# Patient Record
Sex: Male | Born: 1975 | Race: Black or African American | Hispanic: No | Marital: Single | Smoking: Former smoker
Health system: Southern US, Community
[De-identification: ages and names within clinical notes are randomized; demographics above are authoritative.]

---

## 2013-02-06 ENCOUNTER — Inpatient Hospital Stay (HOSPITAL_COMMUNITY)
Admission: EM | Admit: 2013-02-06 | Discharge: 2013-02-08 | DRG: 392 | Disposition: A | Discharge status: Home / Self Care | Payer: MEDICAID | Attending: Internal Medicine | Admitting: Internal Medicine

## 2013-02-06 ENCOUNTER — Encounter (HOSPITAL_COMMUNITY): Payer: Self-pay | Admitting: *Deleted

## 2013-02-06 DIAGNOSIS — K529 Noninfective gastroenteritis and colitis, unspecified: Secondary | ICD-10-CM | POA: Diagnosis present

## 2013-02-06 DIAGNOSIS — R1115 Cyclical vomiting syndrome unrelated to migraine: Secondary | ICD-10-CM

## 2013-02-06 DIAGNOSIS — R509 Fever, unspecified: Secondary | ICD-10-CM

## 2013-02-06 DIAGNOSIS — K5289 Other specified noninfective gastroenteritis and colitis: Principal | ICD-10-CM | POA: Diagnosis present

## 2013-02-06 DIAGNOSIS — R109 Unspecified abdominal pain: Secondary | ICD-10-CM

## 2013-02-06 DIAGNOSIS — F17200 Nicotine dependence, unspecified, uncomplicated: Secondary | ICD-10-CM | POA: Diagnosis present

## 2013-02-06 DIAGNOSIS — E876 Hypokalemia: Secondary | ICD-10-CM | POA: Diagnosis present

## 2013-02-06 LAB — URINALYSIS, ROUTINE W REFLEX MICROSCOPIC
Bilirubin Urine: NEGATIVE
Glucose, UA: NEGATIVE mg/dL
Hgb urine dipstick: NEGATIVE
Ketones, ur: NEGATIVE mg/dL
Leukocytes, UA: NEGATIVE
Protein, ur: NEGATIVE mg/dL
pH: 5.5 (ref 5.0–8.0)

## 2013-02-06 LAB — CBC WITH DIFFERENTIAL/PLATELET
Basophils Absolute: 0 10*3/uL (ref 0.0–0.1)
Basophils Relative: 0 % (ref 0–1)
Eosinophils Relative: 1 % (ref 0–5)
HCT: 48 % (ref 39.0–52.0)
Hemoglobin: 17.4 g/dL — ABNORMAL HIGH (ref 13.0–17.0)
MCHC: 36.3 g/dL — ABNORMAL HIGH (ref 30.0–36.0)
MCV: 93 fL (ref 78.0–100.0)
Monocytes Absolute: 0.7 10*3/uL (ref 0.1–1.0)
Monocytes Relative: 4 % (ref 3–12)
Neutro Abs: 14.5 10*3/uL — ABNORMAL HIGH (ref 1.7–7.7)
RDW: 13.4 % (ref 11.5–15.5)

## 2013-02-06 LAB — COMPREHENSIVE METABOLIC PANEL
BUN: 8 mg/dL (ref 6–23)
CO2: 27 mEq/L (ref 19–32)
Calcium: 9 mg/dL (ref 8.4–10.5)
Chloride: 100 mEq/L (ref 96–112)
Creatinine, Ser: 1 mg/dL (ref 0.50–1.35)
GFR calc non Af Amer: >90 mL/min (ref >90)
Total Bilirubin: 0.7 mg/dL (ref 0.3–1.2)

## 2013-02-06 LAB — LIPASE, BLOOD: Lipase: 32 U/L (ref 11–59)

## 2013-02-06 MED ORDER — FENTANYL CITRATE 0.05 MG/ML IJ SOLN
50.0000 ug | INTRAMUSCULAR | Status: DC | PRN
  Administered 2013-02-06: 50 ug via INTRAVENOUS
  Filled 2013-02-06: qty 2

## 2013-02-06 MED ORDER — POTASSIUM CHLORIDE 10 MEQ/100ML IV SOLN
10.0000 meq | Freq: Once | INTRAVENOUS | Status: AC
  Administered 2013-02-06: 10 meq via INTRAVENOUS
  Filled 2013-02-06: qty 100

## 2013-02-06 MED ORDER — SODIUM CHLORIDE 0.9 % IV BOLUS (SEPSIS)
1000.0000 mL | Freq: Once | INTRAVENOUS | Status: AC
  Administered 2013-02-06: 1000 mL via INTRAVENOUS

## 2013-02-06 MED ORDER — ONDANSETRON HCL 4 MG/2ML IJ SOLN
4.0000 mg | Freq: Once | INTRAMUSCULAR | Status: AC
  Administered 2013-02-06: 4 mg via INTRAVENOUS
  Filled 2013-02-06: qty 2

## 2013-02-06 NOTE — ED Notes (Signed)
The pt is c/o abd pain  Soreness in all  His muscles nv and diarrhea with a temp for 3-4 days

## 2013-02-06 NOTE — ED Provider Notes (Signed)
History     CSN: [id number]  Arrival date & time 02/06/13  2015   First MD Initiated Contact with Patient 02/06/13 2337      Chief Complaint  Patient presents with  . multiple complaints     (Consider location/radiation/quality/duration/timing/severity/associated sxs/prior treatment) HPI History per PT - feeling bad since last night with lower ABD pain RLQ and some LLQ with associated fever and N/V/D, no blood in emesis or stools, no sick contacts or recent travel, no h/o same, no rash. Pain is sharp waxes and wanes. Mod in severity. No difficulty urinating.   History reviewed. No pertinent past medical history.  History reviewed. No pertinent past surgical history.  No family history on file.  History  Substance Use Topics  . Smoking status: Current Every Day Smoker  . Smokeless tobacco: Not on file  . Alcohol Use: Yes      Review of Systems  Constitutional: Positive for fever and chills.  HENT: Negative for neck pain and neck stiffness.   Eyes: Negative for pain.  Respiratory: Negative for shortness of breath.   Cardiovascular: Negative for chest pain.  Gastrointestinal: Positive for vomiting, abdominal pain and diarrhea. Negative for blood in stool.  Genitourinary: Negative for dysuria and flank pain.  Musculoskeletal: Negative for back pain.  Skin: Negative for rash.  Neurological: Negative for headaches.  All other systems reviewed and are negative.    Allergies  Review of patient's allergies indicates not on file.  Home Medications  No current outpatient prescriptions on file.  BP 134/93  Pulse 112  Temp(Src) 100.7 F (38.2 C) (Oral)  Resp 20  SpO2 98%  Physical Exam  Constitutional: He is oriented to person, place, and time. He appears well-developed and well-nourished.  HENT:  Head: Normocephalic and atraumatic.  Eyes: EOM are normal. Pupils are equal, round, and reactive to light.  Neck: Neck supple.  Cardiovascular: Regular rhythm and  intact distal pulses.   Mild tachycardia  Pulmonary/Chest: Effort normal and breath sounds normal. No respiratory distress. He exhibits no tenderness.  Abdominal: Soft. Bowel sounds are normal. He exhibits no distension. There is no rebound and no guarding.  TTP RLQ and mild TTP LLQ, no CVAT  Musculoskeletal: Normal range of motion. He exhibits no edema.  Neurological: He is alert and oriented to person, place, and time.  Skin: Skin is warm and dry.    ED Course  Procedures (including critical care time)  Results for orders placed during the hospital encounter of 02/06/13  URINALYSIS, ROUTINE W REFLEX MICROSCOPIC      Result Value Range   Color, Urine YELLOW  YELLOW   APPearance CLEAR  CLEAR   Specific Gravity, Urine 1.024  1.005 - 1.030   pH 5.5  5.0 - 8.0   Glucose, UA NEGATIVE  NEGATIVE mg/dL   Hgb urine dipstick NEGATIVE  NEGATIVE   Bilirubin Urine NEGATIVE  NEGATIVE   Ketones, ur NEGATIVE  NEGATIVE mg/dL   Protein, ur NEGATIVE  NEGATIVE mg/dL   Urobilinogen, UA 0.2  0.0 - 1.0 mg/dL   Nitrite NEGATIVE  NEGATIVE   Leukocytes, UA NEGATIVE  NEGATIVE  CBC WITH DIFFERENTIAL      Result Value Range   WBC 16.0 (*) 4.0 - 10.5 K/uL   RBC 5.16  4.22 - 5.81 MIL/uL   Hemoglobin 17.4 (*) 13.0 - 17.0 g/dL   HCT 40.9  81.1 - 91.4 %   MCV 93.0  78.0 - 100.0 fL   MCH 33.7  26.0 -  34.0 pg   MCHC 36.3 (*) 30.0 - 36.0 g/dL   RDW 16.1  09.6 - 04.5 %   Platelets 253  150 - 400 K/uL   Neutrophils Relative % 90 (*) 43 - 77 %   Neutro Abs 14.5 (*) 1.7 - 7.7 K/uL   Lymphocytes Relative 5 (*) 12 - 46 %   Lymphs Abs 0.8  0.7 - 4.0 K/uL   Monocytes Relative 4  3 - 12 %   Monocytes Absolute 0.7  0.1 - 1.0 K/uL   Eosinophils Relative 1  0 - 5 %   Eosinophils Absolute 0.1  0.0 - 0.7 K/uL   Basophils Relative 0  0 - 1 %   Basophils Absolute 0.0  0.0 - 0.1 K/uL  COMPREHENSIVE METABOLIC PANEL      Result Value Range   Sodium 136  135 - 145 mEq/L   Potassium 3.1 (*) 3.5 - 5.1 mEq/L   Chloride  100  96 - 112 mEq/L   CO2 27  19 - 32 mEq/L   Glucose, Bld 111 (*) 70 - 99 mg/dL   BUN 8  6 - 23 mg/dL   Creatinine, Ser 4.09  0.50 - 1.35 mg/dL   Calcium 9.0  8.4 - 81.1 mg/dL   Total Protein 7.1  6.0 - 8.3 g/dL   Albumin 4.0  3.5 - 5.2 g/dL   AST 17  0 - 37 U/L   ALT 14  0 - 53 U/L   Alkaline Phosphatase 51  39 - 117 U/L   Total Bilirubin 0.7  0.3 - 1.2 mg/dL   GFR calc non Af Amer >90  >90 mL/min   GFR calc Af Amer >90  >90 mL/min  LIPASE, BLOOD      Result Value Range   Lipase 32  11 - 59 U/L   Ct Abdomen Pelvis W Contrast  02/07/2013   *RADIOLOGY REPORT*  Clinical Data: Lower abdominal pain and fever.  Nausea and vomiting.  CT ABDOMEN AND PELVIS WITH CONTRAST  Technique:  Multidetector CT imaging of the abdomen and pelvis was performed following the standard protocol during bolus administration of intravenous contrast.  Contrast: OMNIPAQUE IOHEXOL 300 MG/ML  SOLN  Comparison: No priors.  Comments:  The examination is significantly limited by a large amount of gross patient motion.  Per report from the technologist, the patient began vomiting during image acquisition.  Findings:  Lung Bases: Unremarkable.  Abdomen/Pelvis:  With the limitations of this study in mind, the appearance of the liver, gallbladder, pancreas, spleen, bilateral adrenal glands and bilateral kidneys is unremarkable.  Normal appendix.  No significant volume of ascites.  No pneumoperitoneum. There are multiple borderline dilated loops of small bowel in the upper abdomen measuring up to 2.9 cm in diameter, however, no definite pathologic dilatation of small bowel is identified.  Oral contrast does extend into the colon.  The prostate gland and urinary bladder are unremarkable in appearance.  No definite pathologic lymphadenopathy identified within the abdomen or pelvis.  Musculoskeletal: There are no aggressive appearing lytic or blastic lesions noted in the visualized portions of the skeleton. Bilateral pars defects  are present at L4 and there is 6 mm of anterolisthesis of L4 upon L5.  IMPRESSION: 1.  No definite acute findings in the abdomen or pelvis to account for the patient's symptoms. 2.  Spondylolisthesis at L4-L5 with 6 mm of anterolisthesis of L4 upon L5.   Original Report Authenticated By: Trudie Reed, M.D.  2:56 AM persistent emesis, d/w [facility], DR Eben Burow to admit  IVFs, NPO, IV fentanyl, zofran IV potassium for hypokalemia  MDM  ABD pain N/V/D, fever CT, labs Persistent emesis, plan MED admit        Sunnie Nielsen, MD 02/07/13 [phone]

## 2013-02-06 NOTE — ED Notes (Signed)
Pt c/o N/V/D, chills, night sweats.

## 2013-02-07 ENCOUNTER — Encounter (HOSPITAL_COMMUNITY): Payer: Self-pay | Admitting: Radiology

## 2013-02-07 ENCOUNTER — Emergency Department (HOSPITAL_COMMUNITY): Payer: Self-pay

## 2013-02-07 DIAGNOSIS — K529 Noninfective gastroenteritis and colitis, unspecified: Secondary | ICD-10-CM | POA: Diagnosis present

## 2013-02-07 DIAGNOSIS — E876 Hypokalemia: Secondary | ICD-10-CM | POA: Diagnosis present

## 2013-02-07 DIAGNOSIS — R109 Unspecified abdominal pain: Secondary | ICD-10-CM

## 2013-02-07 LAB — CLOSTRIDIUM DIFFICILE BY PCR: Toxigenic C. Difficile by PCR: NEGATIVE

## 2013-02-07 MED ORDER — ONDANSETRON HCL 4 MG/2ML IJ SOLN: 4.0000 mg | Freq: Three times a day (TID) | INTRAMUSCULAR | Status: DC | PRN

## 2013-02-07 MED ORDER — METRONIDAZOLE IN NACL 5-0.79 MG/ML-% IV SOLN
500.0000 mg | Freq: Three times a day (TID) | INTRAVENOUS | Status: DC
  Administered 2013-02-07 – 2013-02-08 (×4): 500 mg via INTRAVENOUS
  Filled 2013-02-07 (×6): qty 100

## 2013-02-07 MED ORDER — IOHEXOL 300 MG/ML  SOLN
50.0000 mL | Freq: Once | INTRAMUSCULAR | Status: AC | PRN
  Administered 2013-02-07: 50 mL via ORAL

## 2013-02-07 MED ORDER — BIOTENE DRY MOUTH MT LIQD: 15.0000 mL | Freq: Two times a day (BID) | OROMUCOSAL | Status: DC

## 2013-02-07 MED ORDER — ENOXAPARIN SODIUM 40 MG/0.4ML ~~LOC~~ SOLN
40.0000 mg | Freq: Every day | SUBCUTANEOUS | Status: DC
  Administered 2013-02-07: 40 mg via SUBCUTANEOUS
  Filled 2013-02-07 (×2): qty 0.4

## 2013-02-07 MED ORDER — SODIUM CHLORIDE 0.9 % IV SOLN
INTRAVENOUS | Status: AC
Start: 2013-02-07 — End: 2013-02-07
  Administered 2013-02-07: 150 mL/h via INTRAVENOUS

## 2013-02-07 MED ORDER — CIPROFLOXACIN IN D5W 400 MG/200ML IV SOLN
400.0000 mg | Freq: Two times a day (BID) | INTRAVENOUS | Status: DC
  Administered 2013-02-07 – 2013-02-08 (×3): 400 mg via INTRAVENOUS
  Filled 2013-02-07 (×4): qty 200

## 2013-02-07 MED ORDER — IOHEXOL 300 MG/ML  SOLN
100.0000 mL | Freq: Once | INTRAMUSCULAR | Status: AC | PRN
  Administered 2013-02-07: 100 mL via INTRAVENOUS

## 2013-02-07 MED ORDER — CHLORHEXIDINE GLUCONATE 0.12 % MT SOLN
15.0000 mL | Freq: Two times a day (BID) | OROMUCOSAL | Status: DC
  Administered 2013-02-07: 15 mL via OROMUCOSAL
  Filled 2013-02-07: qty 15

## 2013-02-07 MED ORDER — SODIUM CHLORIDE 0.9 % IV SOLN
INTRAVENOUS | Status: DC
  Administered 2013-02-07 – 2013-02-08 (×2): via INTRAVENOUS

## 2013-02-07 MED ORDER — ONDANSETRON 8 MG/NS 50 ML IVPB
8.0000 mg | Freq: Four times a day (QID) | INTRAVENOUS | Status: DC | PRN
  Filled 2013-02-07 (×2): qty 8

## 2013-02-07 MED ORDER — MORPHINE SULFATE 2 MG/ML IJ SOLN
2.0000 mg | INTRAMUSCULAR | Status: DC | PRN
  Administered 2013-02-07: 2 mg via INTRAVENOUS
  Filled 2013-02-07: qty 1

## 2013-02-07 MED ORDER — ONDANSETRON HCL 4 MG/2ML IJ SOLN: 4.0000 mg | Freq: Four times a day (QID) | INTRAMUSCULAR | Status: DC | PRN

## 2013-02-07 MED ORDER — SODIUM CHLORIDE 0.9 % IV BOLUS (SEPSIS): 1000.0000 mL | Freq: Once | INTRAVENOUS | Status: DC

## 2013-02-07 MED ORDER — POTASSIUM CHLORIDE 10 MEQ/100ML IV SOLN
10.0000 meq | INTRAVENOUS | Status: AC
Start: 2013-02-07 — End: 2013-02-07
  Administered 2013-02-07 (×6): 10 meq via INTRAVENOUS
  Filled 2013-02-07 (×4): qty 100
  Filled 2013-02-07: qty 200

## 2013-02-07 MED ORDER — ONDANSETRON HCL 4 MG/2ML IJ SOLN
4.0000 mg | Freq: Once | INTRAMUSCULAR | Status: AC
  Administered 2013-02-07: 4 mg via INTRAVENOUS
  Filled 2013-02-07: qty 2

## 2013-02-07 NOTE — H&P (Signed)
History and Physical  [name] NWG:[id number] DOB: 10/08/1975 DOA: 02/06/2013  Referring physician: Sunnie Nielsen MD PCP: No PCP Per Patient   Chief Complaint: Abdominal pain, nausea and vomiting  HPI: Patient is a 37 year old man with no significant past medical history who presents with lower abdominal pain for 3 days and associated vomiting and diarrhea since 1 day prior to admission. Patient works at [employer] and usually eats at work itself. He denies any [place] or food which had been For a long time. Patient started having abdominal pain 3 days prior to admission. Located in the lower abdomen, nonradiating, cramping in character, progressively got worse, 8/10 at its worse, no exacerbating or relieving factors. Associated with vomiting and diarrhea since yesterday. Patient has had about 4-5 episodes of watery diarrhea. Has vomited about 6-7 times. Vomitus contained food particles and anything that he eats. Patient also had fevers along with chills but did not take a temperature.  Initial evaluation in the ER was suggestive of fevers up to 100.7, tachycardia 112, elevated white count and persistent abdominal pain. Hospitalist team was asked to admit the patient for further evaluation and management.  15 point review of systems was negative except for as noted in the history of present illness.   History reviewed. No pertinent past medical history.  History reviewed. No pertinent past surgical history.  Social History:  reports that he has been smoking.  He does not have any smokeless tobacco history on file. He reports that  drinks alcohol. His drug history is not on file.  No Known Allergies  No family history on file. Family history is noncontributory  Prior to Admission medications   Not on File   Physical Exam: Filed Vitals:   02/06/13 2330 02/07/13 0247 02/07/13 0307 02/07/13 0312  BP: 134/93 125/76 128/93   Pulse: 112 100 105   Temp: 100.7 F (38.2 C)   99.1 F (37.3  C)  TempSrc: Oral   Oral  Resp: 20 18 16    SpO2: 98% 96% 94%    Physical Exam: General: Vital signs reviewed and noted. Well-developed, well-nourished, in no acute distress; alert, appropriate and cooperative throughout examination.  Head: Normocephalic, atraumatic.  Eyes: PERRL, EOMI, No signs of anemia or jaundince.  Nose: Mucous membranes moist, not inflammed, nonerythematous.  Throat: Oropharynx nonerythematous, no exudate appreciated.   Neck: No deformities, masses, or tenderness noted.Supple, No carotid Bruits, no JVD.  Lungs:  Normal respiratory effort. Clear to auscultation BL without crackles or wheezes.  Heart:  tachycardic RR. S1 and S2 normal without gallop, murmur, or rubs.  Abdomen:  BS hyperactive. Soft, Nondistended, tender to palpation in the lower mid abdomen.  No masses or organomegaly.  Extremities: No pretibial edema.  Neurologic: A&O X3, CN II - XII are grossly intact. Motor strength is 5/5 in the all 4 extremities, Sensations intact to light touch, Cerebellar signs negative.  Skin: No visible rashes, scars.     Wt Readings from Last 3 Encounters:  No data found for Wt    Labs on Admission:  Basic Metabolic Panel:  Recent Labs Lab 02/06/13 2025  NA 136  K 3.1*  CL 100  CO2 27  GLUCOSE 111*  BUN 8  CREATININE 1.00  CALCIUM 9.0    Liver Function Tests:  Recent Labs Lab 02/06/13 2025  AST 17  ALT 14  ALKPHOS 51  BILITOT 0.7  PROT 7.1  ALBUMIN 4.0    Recent Labs Lab 02/06/13 2025  LIPASE 32  CBC:  Recent Labs Lab 02/06/13 2025  WBC 16.0*  NEUTROABS 14.5*  HGB 17.4*  HCT 48.0  MCV 93.0  PLT 253     Radiological Exams on Admission: Ct Abdomen Pelvis W Contrast  02/07/2013   *RADIOLOGY REPORT*  Clinical Data: Lower abdominal pain and fever.  Nausea and vomiting.  CT ABDOMEN AND PELVIS WITH CONTRAST  Technique:  Multidetector CT imaging of the abdomen and pelvis was performed following the standard protocol during bolus  administration of intravenous contrast.  Contrast: OMNIPAQUE IOHEXOL 300 MG/ML  SOLN  Comparison: No priors.  Comments:  The examination is significantly limited by a large amount of gross patient motion.  Per report from the technologist, the patient began vomiting during image acquisition.  Findings:  Lung Bases: Unremarkable.  Abdomen/Pelvis:  With the limitations of this study in mind, the appearance of the liver, gallbladder, pancreas, spleen, bilateral adrenal glands and bilateral kidneys is unremarkable.  Normal appendix.  No significant volume of ascites.  No pneumoperitoneum. There are multiple borderline dilated loops of small bowel in the upper abdomen measuring up to 2.9 cm in diameter, however, no definite pathologic dilatation of small bowel is identified.  Oral contrast does extend into the colon.  The prostate gland and urinary bladder are unremarkable in appearance.  No definite pathologic lymphadenopathy identified within the abdomen or pelvis.  Musculoskeletal: There are no aggressive appearing lytic or blastic lesions noted in the visualized portions of the skeleton. Bilateral pars defects are present at L4 and there is 6 mm of anterolisthesis of L4 upon L5.  IMPRESSION: 1.  No definite acute findings in the abdomen or pelvis to account for the patient's symptoms. 2.  Spondylolisthesis at L4-L5 with 6 mm of anterolisthesis of L4 upon L5.   Original Report Authenticated By: Trudie Reed, M.D.     Principal Problem:   Acute gastroenteritis Active Problems:   Hypokalemia   Assessment/Plan Patient is a 37 year old man with no significant past medical history who was admitted for severe lower abdominal pain, nausea, vomiting and diarrhea. With fevers and chills and elevated white count but this seems that patient has had acute gastroenteritis.  -Admit the patient to MedSurg -IV fluids -Ondansetron for control of nausea and vomiting -Pain control with morphine -IV ciprofloxacin  and Flagyl -C. difficile PCR -Stools for a while and parasites -Blood cultures drawn in the ER -Follow clinically -Replete electrolytes -stool culture  Code Status: Full code Family Communication: Mother updated at bedside Disposition Plan/Anticipated LOS: Guarded  Time spent: 52 minutes  Lars Mage, MD  [facility] Team 5  If 7PM-7AM, please contact night-coverage at [email or web address], password [facility] 02/07/2013, 4:06 AM

## 2013-02-07 NOTE — Progress Notes (Signed)
PAteint admitted early this AM by Dr. Eben Burow- please see H&p.  Patient sleeping- will start clear diet and advance as tolerated.  Labs in AM  [place] DO

## 2013-02-07 NOTE — ED Notes (Signed)
Collis Thede cell [phone] house [phone]

## 2013-02-08 DIAGNOSIS — R1115 Cyclical vomiting syndrome unrelated to migraine: Secondary | ICD-10-CM

## 2013-02-08 DIAGNOSIS — E876 Hypokalemia: Secondary | ICD-10-CM

## 2013-02-08 DIAGNOSIS — K5289 Other specified noninfective gastroenteritis and colitis: Principal | ICD-10-CM

## 2013-02-08 LAB — GI PATHOGEN PANEL BY PCR, STOOL
C difficile toxin A/B: NEGATIVE
Campylobacter by PCR: NEGATIVE
E coli (ETEC) LT/ST: NEGATIVE
E coli (STEC): NEGATIVE
Norovirus GI/GII: POSITIVE

## 2013-02-08 LAB — CBC
HCT: 42.3 % (ref 39.0–52.0)
Hemoglobin: 14.8 g/dL (ref 13.0–17.0)
MCV: 93.4 fL (ref 78.0–100.0)
RBC: 4.53 MIL/uL (ref 4.22–5.81)
WBC: 7.8 10*3/uL (ref 4.0–10.5)

## 2013-02-08 LAB — OVA AND PARASITE EXAMINATION: Ova and parasites: NONE SEEN

## 2013-02-08 LAB — BASIC METABOLIC PANEL
BUN: 7 mg/dL (ref 6–23)
CO2: 30 mEq/L (ref 19–32)
Chloride: 103 mEq/L (ref 96–112)
Creatinine, Ser: 1.1 mg/dL (ref 0.50–1.35)
Glucose, Bld: 98 mg/dL (ref 70–99)

## 2013-02-08 NOTE — Discharge Summary (Signed)
Physician Discharge Summary  [name] WGN:[id number] DOB: 1976/01/19 DOA: 02/06/2013  PCP: No PCP Per Patient  Admit date: 02/06/2013 Discharge date: 02/08/2013  Time spent: 30 minutes  Recommendations for Outpatient Follow-up:  Advance diet as tolerated WASH HANDS FREQUENTLY WITH SOAP AND WATER  Discharge Diagnoses:  Principal Problem:   Acute gastroenteritis Active Problems:   Hypokalemia   Discharge Condition: Improved  Diet recommendation: Regular as tolerated  Filed Weights   02/07/13 0500  Weight: 88.451 kg (195 lb)    History of present illness:  Patient is a 37 year old man with no significant past medical history who presents with lower abdominal pain for 3 days and associated vomiting and diarrhea since 1 day prior to admission. Patient works at [employer] and usually eats at work itself. He denies any [place] or food which had been For a long time. Patient started having abdominal pain 3 days prior to admission. Located in the lower abdomen, nonradiating, cramping in character, progressively got worse, 8/10 at its worse, no exacerbating or relieving factors. Associated with vomiting and diarrhea since yesterday. Patient has had about 4-5 episodes of watery diarrhea. Has vomited about 6-7 times. Vomitus contained food particles and anything that he eats. Patient also had fevers along with chills but did not take a temperature.  Initial evaluation in the ER was suggestive of fevers up to 100.7, tachycardia 112, elevated white count and persistent abdominal pain. Hospitalist team was asked to admit the patient for further evaluation and management.   Hospital Course:  The patient was started on empiric cipro and flagyl and observed overnight. The patient was found to be negative for c.diff. Stool o&p are still pending at the time of this dictation. However, by the following morning, the patient's symptoms have improved notably. He remains afebrile with no  leukocytosis.  Discharge Exam: Filed Vitals:   02/07/13 0500 02/07/13 0926 02/07/13 2237 02/08/13 0603  BP: 134/91 132/88 118/78 121/80  Pulse: 98 116 86 74  Temp: 99.7 F (37.6 C) 98.9 F (37.2 C) 98.3 F (36.8 C) 98.8 F (37.1 C)  TempSrc: Oral Oral Oral Oral  Resp: 18 17 16 18   Height: 5\' 9"  (1.753 m)     Weight: 88.451 kg (195 lb)     SpO2: 100% 100% 100% 100%    General: Awake, in nad Cardiovascular: regular, s1, s2 Respiratory: normal resp effort, no wheezing  Discharge Instructions     Medication List     As of 02/08/2013 10:56 AM    Notice      You have not been prescribed any medications.        No Known Allergies Follow-up Information   Schedule an appointment as soon as possible for a visit with No PCP Per Patient.   Contact information:   [address] [place] [address] [phone]        The results of significant diagnostics from this hospitalization (including imaging, microbiology, ancillary and laboratory) are listed below for reference.    Significant Diagnostic Studies: Ct Abdomen Pelvis W Contrast  02/07/2013   *RADIOLOGY REPORT*  Clinical Data: Lower abdominal pain and fever.  Nausea and vomiting.  CT ABDOMEN AND PELVIS WITH CONTRAST  Technique:  Multidetector CT imaging of the abdomen and pelvis was performed following the standard protocol during bolus administration of intravenous contrast.  Contrast: OMNIPAQUE IOHEXOL 300 MG/ML  SOLN  Comparison: No priors.  Comments:  The examination is significantly limited by a large amount of gross patient  motion.  Per report from the technologist, the patient began vomiting during image acquisition.  Findings:  Lung Bases: Unremarkable.  Abdomen/Pelvis:  With the limitations of this study in mind, the appearance of the liver, gallbladder, pancreas, spleen, bilateral adrenal glands and bilateral kidneys is unremarkable.  Normal appendix.  No significant volume of ascites.  No  pneumoperitoneum. There are multiple borderline dilated loops of small bowel in the upper abdomen measuring up to 2.9 cm in diameter, however, no definite pathologic dilatation of small bowel is identified.  Oral contrast does extend into the colon.  The prostate gland and urinary bladder are unremarkable in appearance.  No definite pathologic lymphadenopathy identified within the abdomen or pelvis.  Musculoskeletal: There are no aggressive appearing lytic or blastic lesions noted in the visualized portions of the skeleton. Bilateral pars defects are present at L4 and there is 6 mm of anterolisthesis of L4 upon L5.  IMPRESSION: 1.  No definite acute findings in the abdomen or pelvis to account for the patient's symptoms. 2.  Spondylolisthesis at L4-L5 with 6 mm of anterolisthesis of L4 upon L5.   Original Report Authenticated By: Trudie Reed, M.D.    Microbiology: Recent Results (from the past 240 hour(s))  CLOSTRIDIUM DIFFICILE BY PCR     Status: None   Collection Time    02/07/13 11:45 AM      Result Value Range Status   C difficile by pcr NEGATIVE  NEGATIVE Final  STOOL CULTURE     Status: None   Collection Time    02/07/13 11:45 AM      Result Value Range Status   Specimen Description STOOL   Final   Special Requests NONE   Final   Culture Culture reincubated for better growth   Final   Report Status PENDING   Incomplete     Labs: Basic Metabolic Panel:  Recent Labs Lab 02/06/13 2025 02/08/13 0425  NA 136 138  K 3.1* 3.8  CL 100 103  CO2 27 30  GLUCOSE 111* 98  BUN 8 7  CREATININE 1.00 1.10  CALCIUM 9.0 8.6   Liver Function Tests:  Recent Labs Lab 02/06/13 2025  AST 17  ALT 14  ALKPHOS 51  BILITOT 0.7  PROT 7.1  ALBUMIN 4.0    Recent Labs Lab 02/06/13 2025  LIPASE 32   No results found for this basename: AMMONIA,  in the last 168 hours CBC:  Recent Labs Lab 02/06/13 2025 02/08/13 0425  WBC 16.0* 7.8  NEUTROABS 14.5*  --   HGB 17.4* 14.8  HCT  48.0 42.3  MCV 93.0 93.4  PLT 253 215   Cardiac Enzymes: No results found for this basename: CKTOTAL, CKMB, CKMBINDEX, TROPONINI,  in the last 168 hours BNP: BNP (last 3 results) No results found for this basename: PROBNP,  in the last 8760 hours CBG: No results found for this basename: GLUCAP,  in the last 168 hours     Signed:  Mariyam Remington K  [facility] 02/08/2013, 10:56 AM

## 2013-02-11 LAB — STOOL CULTURE

## 2014-08-31 ENCOUNTER — Inpatient Hospital Stay (HOSPITAL_COMMUNITY)
Admission: EM | Admit: 2014-08-31 | Discharge: 2014-09-06 | DRG: 516 | Disposition: A | Discharge status: Rehabilitation Facility | Payer: No Typology Code available for payment source | Attending: General Surgery | Admitting: General Surgery

## 2014-08-31 ENCOUNTER — Emergency Department (HOSPITAL_COMMUNITY): Payer: No Typology Code available for payment source

## 2014-08-31 ENCOUNTER — Encounter (HOSPITAL_COMMUNITY): Payer: Self-pay | Admitting: *Deleted

## 2014-08-31 DIAGNOSIS — F17210 Nicotine dependence, cigarettes, uncomplicated: Secondary | ICD-10-CM | POA: Diagnosis present

## 2014-08-31 DIAGNOSIS — M25551 Pain in right hip: Secondary | ICD-10-CM | POA: Diagnosis not present

## 2014-08-31 DIAGNOSIS — F1010 Alcohol abuse, uncomplicated: Secondary | ICD-10-CM | POA: Diagnosis present

## 2014-08-31 DIAGNOSIS — E871 Hypo-osmolality and hyponatremia: Secondary | ICD-10-CM | POA: Diagnosis present

## 2014-08-31 DIAGNOSIS — M4316 Spondylolisthesis, lumbar region: Secondary | ICD-10-CM | POA: Diagnosis present

## 2014-08-31 DIAGNOSIS — S32401A Unspecified fracture of right acetabulum, initial encounter for closed fracture: Secondary | ICD-10-CM

## 2014-08-31 DIAGNOSIS — K5900 Constipation, unspecified: Secondary | ICD-10-CM | POA: Diagnosis present

## 2014-08-31 DIAGNOSIS — S32431A Displaced fracture of anterior column [iliopubic] of right acetabulum, initial encounter for closed fracture: Secondary | ICD-10-CM | POA: Diagnosis present

## 2014-08-31 DIAGNOSIS — E876 Hypokalemia: Secondary | ICD-10-CM | POA: Diagnosis not present

## 2014-08-31 DIAGNOSIS — S32441A Displaced fracture of posterior column [ilioischial] of right acetabulum, initial encounter for closed fracture: Principal | ICD-10-CM | POA: Diagnosis present

## 2014-08-31 DIAGNOSIS — S329XXA Fracture of unspecified parts of lumbosacral spine and pelvis, initial encounter for closed fracture: Secondary | ICD-10-CM

## 2014-08-31 DIAGNOSIS — S32019A Unspecified fracture of first lumbar vertebra, initial encounter for closed fracture: Secondary | ICD-10-CM | POA: Diagnosis present

## 2014-08-31 DIAGNOSIS — S32009A Unspecified fracture of unspecified lumbar vertebra, initial encounter for closed fracture: Secondary | ICD-10-CM | POA: Diagnosis present

## 2014-08-31 DIAGNOSIS — S32409A Unspecified fracture of unspecified acetabulum, initial encounter for closed fracture: Secondary | ICD-10-CM

## 2014-08-31 DIAGNOSIS — S2241XA Multiple fractures of ribs, right side, initial encounter for closed fracture: Secondary | ICD-10-CM | POA: Diagnosis present

## 2014-08-31 DIAGNOSIS — K529 Noninfective gastroenteritis and colitis, unspecified: Secondary | ICD-10-CM | POA: Diagnosis present

## 2014-08-31 MED ORDER — FENTANYL CITRATE 0.05 MG/ML IJ SOLN
50.0000 ug | Freq: Once | INTRAMUSCULAR | Status: AC
  Administered 2014-08-31: 50 ug via INTRAVENOUS
  Filled 2014-08-31: qty 2
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
## 2014-09-01 ENCOUNTER — Emergency Department (HOSPITAL_COMMUNITY): Payer: No Typology Code available for payment source

## 2014-09-01 ENCOUNTER — Encounter (HOSPITAL_COMMUNITY): Payer: Self-pay | Admitting: General Practice

## 2014-09-01 DIAGNOSIS — K529 Noninfective gastroenteritis and colitis, unspecified: Secondary | ICD-10-CM | POA: Diagnosis present

## 2014-09-01 DIAGNOSIS — F17210 Nicotine dependence, cigarettes, uncomplicated: Secondary | ICD-10-CM | POA: Diagnosis present

## 2014-09-01 DIAGNOSIS — S32009A Unspecified fracture of unspecified lumbar vertebra, initial encounter for closed fracture: Secondary | ICD-10-CM | POA: Diagnosis present

## 2014-09-01 DIAGNOSIS — M25551 Pain in right hip: Secondary | ICD-10-CM | POA: Diagnosis present

## 2014-09-01 DIAGNOSIS — K5900 Constipation, unspecified: Secondary | ICD-10-CM | POA: Diagnosis present

## 2014-09-01 DIAGNOSIS — S2241XA Multiple fractures of ribs, right side, initial encounter for closed fracture: Secondary | ICD-10-CM | POA: Diagnosis present

## 2014-09-01 DIAGNOSIS — S32431A Displaced fracture of anterior column [iliopubic] of right acetabulum, initial encounter for closed fracture: Secondary | ICD-10-CM | POA: Diagnosis present

## 2014-09-01 DIAGNOSIS — S32441A Displaced fracture of posterior column [ilioischial] of right acetabulum, initial encounter for closed fracture: Secondary | ICD-10-CM | POA: Diagnosis present

## 2014-09-01 DIAGNOSIS — F1010 Alcohol abuse, uncomplicated: Secondary | ICD-10-CM | POA: Diagnosis present

## 2014-09-01 DIAGNOSIS — S32401A Unspecified fracture of right acetabulum, initial encounter for closed fracture: Secondary | ICD-10-CM | POA: Diagnosis present

## 2014-09-01 DIAGNOSIS — M4316 Spondylolisthesis, lumbar region: Secondary | ICD-10-CM | POA: Diagnosis present

## 2014-09-01 DIAGNOSIS — E871 Hypo-osmolality and hyponatremia: Secondary | ICD-10-CM | POA: Diagnosis present

## 2014-09-01 DIAGNOSIS — S32019A Unspecified fracture of first lumbar vertebra, initial encounter for closed fracture: Secondary | ICD-10-CM | POA: Diagnosis present

## 2014-09-01 DIAGNOSIS — E876 Hypokalemia: Secondary | ICD-10-CM | POA: Diagnosis not present

## 2014-09-01 LAB — CBC WITH DIFFERENTIAL/PLATELET
BASOS ABS: 0 10*3/uL (ref 0.0–0.1)
Basophils Relative: 0 % (ref 0–1)
Eosinophils Absolute: 0.1 10*3/uL (ref 0.0–0.7)
Eosinophils Relative: 1 % (ref 0–5)
HCT: 39.3 % (ref 39.0–52.0)
Hemoglobin: 13.4 g/dL (ref 13.0–17.0)
LYMPHS ABS: 1 10*3/uL (ref 0.7–4.0)
Lymphocytes Relative: 7 % — ABNORMAL LOW (ref 12–46)
MCH: 31.8 pg (ref 26.0–34.0)
MCHC: 34.1 g/dL (ref 30.0–36.0)
MCV: 93.1 fL (ref 78.0–100.0)
MONO ABS: 1.4 10*3/uL — AB (ref 0.1–1.0)
MONOS PCT: 9 % (ref 3–12)
NEUTROS PCT: 83 % — AB (ref 43–77)
Neutro Abs: 13.4 10*3/uL — ABNORMAL HIGH (ref 1.7–7.7)
Platelets: 212 10*3/uL (ref 150–400)
RBC: 4.22 MIL/uL (ref 4.22–5.81)
RDW: 14.3 % (ref 11.5–15.5)
WBC: 15.9 10*3/uL — AB (ref 4.0–10.5)

## 2014-09-01 LAB — BASIC METABOLIC PANEL
ANION GAP: 11 (ref 5–15)
BUN: 11 mg/dL (ref 6–23)
CO2: 22 mmol/L (ref 19–32)
Calcium: 8.9 mg/dL (ref 8.4–10.5)
Chloride: 105 mEq/L (ref 96–112)
Creatinine, Ser: 1.01 mg/dL (ref 0.50–1.35)
GFR calc non Af Amer: >90 mL/min (ref >90)
Glucose, Bld: 128 mg/dL — ABNORMAL HIGH (ref 70–99)
POTASSIUM: 3.8 mmol/L (ref 3.5–5.1)
Sodium: 138 mmol/L (ref 135–145)

## 2014-09-01 MED ORDER — POTASSIUM CHLORIDE IN NACL 20-0.45 MEQ/L-% IV SOLN
INTRAVENOUS | Status: DC
  Administered 2014-09-01 – 2014-09-04 (×5): via INTRAVENOUS
  Filled 2014-09-01 (×9): qty 1000

## 2014-09-01 MED ORDER — FENTANYL CITRATE 0.05 MG/ML IJ SOLN
100.0000 ug | Freq: Once | INTRAMUSCULAR | Status: AC
  Administered 2014-09-01: 100 ug via INTRAVENOUS
  Filled 2014-09-01: qty 2

## 2014-09-01 MED ORDER — IOHEXOL 300 MG/ML  SOLN
100.0000 mL | Freq: Once | INTRAMUSCULAR | Status: AC | PRN
  Administered 2014-09-01: 100 mL via INTRAVENOUS

## 2014-09-01 MED ORDER — POLYETHYLENE GLYCOL 3350 17 G PO PACK
17.0000 g | PACK | Freq: Every day | ORAL | Status: DC
  Administered 2014-09-02 – 2014-09-06 (×4): 17 g via ORAL
  Filled 2014-09-01 (×5): qty 1

## 2014-09-01 MED ORDER — PANTOPRAZOLE SODIUM 40 MG PO TBEC
40.0000 mg | DELAYED_RELEASE_TABLET | Freq: Every day | ORAL | Status: DC
  Administered 2014-09-02 – 2014-09-03 (×2): 40 mg via ORAL
  Filled 2014-09-01 (×2): qty 1

## 2014-09-01 MED ORDER — INFLUENZA VAC SPLIT QUAD 0.5 ML IM SUSY
0.5000 mL | PREFILLED_SYRINGE | INTRAMUSCULAR | Status: AC
  Administered 2014-09-02: 0.5 mL via INTRAMUSCULAR
  Filled 2014-09-01: qty 0.5

## 2014-09-01 MED ORDER — OXYCODONE-ACETAMINOPHEN 5-325 MG PO TABS
1.0000 | ORAL_TABLET | Freq: Four times a day (QID) | ORAL | Status: DC | PRN
  Administered 2014-09-02 – 2014-09-05 (×6): 2 via ORAL
  Filled 2014-09-01 (×7): qty 2

## 2014-09-01 MED ORDER — PANTOPRAZOLE SODIUM 40 MG IV SOLR
40.0000 mg | Freq: Every day | INTRAVENOUS | Status: DC
  Filled 2014-09-01: qty 40

## 2014-09-01 MED ORDER — HYDROMORPHONE HCL 1 MG/ML IJ SOLN
0.5000 mg | INTRAMUSCULAR | Status: DC | PRN
  Administered 2014-09-01 – 2014-09-05 (×7): 0.5 mg via INTRAVENOUS
  Filled 2014-09-01 (×7): qty 1

## 2014-09-01 MED ORDER — ENOXAPARIN SODIUM 30 MG/0.3ML ~~LOC~~ SOLN
30.0000 mg | Freq: Two times a day (BID) | SUBCUTANEOUS | Status: AC
  Administered 2014-09-01 – 2014-09-02 (×3): 30 mg via SUBCUTANEOUS
  Filled 2014-09-01 (×4): qty 0.3

## 2014-09-01 MED ORDER — DEXTROSE-NACL 5-0.9 % IV SOLN
INTRAVENOUS | Status: DC
  Administered 2014-09-01 – 2014-09-06 (×2): via INTRAVENOUS

## 2014-09-01 MED ORDER — PNEUMOCOCCAL VAC POLYVALENT 25 MCG/0.5ML IJ INJ: 0.5000 mL | INJECTION | INTRAMUSCULAR | Status: DC

## 2014-09-01 MED ORDER — ONDANSETRON HCL 4 MG/2ML IJ SOLN: 4.0000 mg | Freq: Four times a day (QID) | INTRAMUSCULAR | Status: DC | PRN

## 2014-09-01 MED ORDER — METHOCARBAMOL 500 MG PO TABS
1000.0000 mg | ORAL_TABLET | Freq: Four times a day (QID) | ORAL | Status: DC | PRN
  Administered 2014-09-02 – 2014-09-06 (×9): 1000 mg via ORAL
  Filled 2014-09-01 (×10): qty 2

## 2014-09-01 MED ORDER — ONDANSETRON HCL 4 MG PO TABS: 4.0000 mg | ORAL_TABLET | Freq: Four times a day (QID) | ORAL | Status: DC | PRN

## 2014-09-01 MED ORDER — DOCUSATE SODIUM 100 MG PO CAPS
100.0000 mg | ORAL_CAPSULE | Freq: Two times a day (BID) | ORAL | Status: DC
  Administered 2014-09-01 – 2014-09-06 (×9): 100 mg via ORAL
  Filled 2014-09-01 (×12): qty 1

## 2014-09-01 MED ORDER — OXYCODONE HCL 5 MG PO TABS
5.0000 mg | ORAL_TABLET | ORAL | Status: DC | PRN
  Administered 2014-09-01: 10 mg via ORAL
  Administered 2014-09-01 – 2014-09-05 (×13): 15 mg via ORAL
  Filled 2014-09-01: qty 3
  Filled 2014-09-01: qty 2
  Filled 2014-09-01 (×12): qty 3
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
## 2014-09-01 NOTE — ED Notes (Addendum)
Patient transported to CT and X-ray by this RN
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
## 2014-09-01 NOTE — ED Notes (Signed)
Pt taken to CT.

## 2014-09-01 NOTE — ED Notes (Signed)
Dr Wyatt at bedside.  
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
## 2014-09-01 NOTE — Consult Note (Signed)
Initial consult note.  I have reviewed his Xray and he has looks to have an LC injury to his R acetabulum, Weight bearing dome looks intact. CT pending to assess injury further.   Trauma w/u pending per the trauma service. He will likely need admission and mobilization by PT  After CT is available I will perform a full consult and assess need for skeletal traction and/or surgery vs protected WB and mobilization  For now place in bucks traction and continue trauma work up.   Margarita RanaMURPHY, Naba Sneed, D Cell: [phone]

## 2014-09-01 NOTE — Progress Notes (Signed)
Orthopedic Tech Progress Note Patient Details:  [name] 08/19/1976 [id number]      [name], Malanie Koloski M 09/01/2014, 1:04 AM
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
## 2014-09-01 NOTE — Progress Notes (Signed)
UR completed.  Marjoria Mancillas, RN BSN MHA CCM Trauma/Neuro ICU Case Manager [phone]  

## 2014-09-01 NOTE — ED Notes (Signed)
Family at bedside. 
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
## 2014-09-01 NOTE — ED Notes (Signed)
Ortho tech paged  

## 2014-09-02 ENCOUNTER — Inpatient Hospital Stay (HOSPITAL_COMMUNITY): Payer: No Typology Code available for payment source

## 2014-09-02 LAB — CBC
HCT: 39 % (ref 39.0–52.0)
HEMOGLOBIN: 12.8 g/dL — AB (ref 13.0–17.0)
MCH: 30.4 pg (ref 26.0–34.0)
MCHC: 32.8 g/dL (ref 30.0–36.0)
MCV: 92.6 fL (ref 78.0–100.0)
Platelets: 210 10*3/uL (ref 150–400)
RBC: 4.21 MIL/uL — AB (ref 4.22–5.81)
RDW: 14.8 % (ref 11.5–15.5)
WBC: 9 10*3/uL (ref 4.0–10.5)

## 2014-09-02 MED ORDER — ONDANSETRON 4 MG PO TBDP: 4.0000 mg | ORAL_TABLET | Freq: Four times a day (QID) | ORAL | Status: DC | PRN

## 2014-09-02 MED ORDER — ONDANSETRON HCL 4 MG PO TABS: 4.0000 mg | ORAL_TABLET | Freq: Four times a day (QID) | ORAL | Status: DC | PRN
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
## 2014-09-02 NOTE — Progress Notes (Signed)
PT Cancellation Note  Patient Details Name: [name] MRN: [id number] DOB: 03/01/1976   Cancelled Treatment:    Reason Eval/Treat Not Completed: Patient not medically ready .  Per Ortho note, patient on strict bedrest until surgery on Monday.  Will await new orders after surgery.  Olivia CanterMoton, Tilman Mcclaren M 09/02/2014, 1:49 PM
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
## 2014-09-02 NOTE — Progress Notes (Signed)
OT Cancellation Note  Patient Details Name: [name] MRN: [id number] DOB: 04/26/1976   Cancelled Treatment:    Reason Eval/Treat Not Completed: Patient not medically ready Noted per ortho note pt on bedrest.  Will check back on pt post surgery. Thanks, Lise AuerLori Bartolo [place], [place] [phone]  Einar CrowEDDING, Riley Hallum D 09/02/2014, 7:45 AM
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
## 2014-09-04 ENCOUNTER — Encounter (HOSPITAL_COMMUNITY): Payer: Self-pay | Admitting: Anesthesiology

## 2014-09-04 ENCOUNTER — Inpatient Hospital Stay (HOSPITAL_COMMUNITY): Payer: No Typology Code available for payment source | Admitting: Anesthesiology

## 2014-09-04 ENCOUNTER — Encounter (HOSPITAL_COMMUNITY): Admission: EM | Disposition: A | Discharge status: Rehabilitation Facility | Payer: Self-pay | Source: Home / Self Care

## 2014-09-04 ENCOUNTER — Inpatient Hospital Stay (HOSPITAL_COMMUNITY): Payer: No Typology Code available for payment source

## 2014-09-04 HISTORY — PX: ORIF ACETABULAR FRACTURE: SHX5029

## 2014-09-04 LAB — COMPREHENSIVE METABOLIC PANEL
ALK PHOS: 51 U/L (ref 39–117)
ALT: 19 U/L (ref 0–53)
AST: 23 U/L (ref 0–37)
Albumin: 3.4 g/dL — ABNORMAL LOW (ref 3.5–5.2)
Anion gap: 4 — ABNORMAL LOW (ref 5–15)
BUN: 9 mg/dL (ref 6–23)
CHLORIDE: 101 meq/L (ref 96–112)
CO2: 30 mmol/L (ref 19–32)
CREATININE: 0.91 mg/dL (ref 0.50–1.35)
Calcium: 8.6 mg/dL (ref 8.4–10.5)
GFR calc Af Amer: >90 mL/min (ref >90)
GFR calc non Af Amer: >90 mL/min (ref >90)
Glucose, Bld: 89 mg/dL (ref 70–99)
POTASSIUM: 4.4 mmol/L (ref 3.5–5.1)
Sodium: 135 mmol/L (ref 135–145)
TOTAL PROTEIN: 7.1 g/dL (ref 6.0–8.3)
Total Bilirubin: 0.8 mg/dL (ref 0.3–1.2)

## 2014-09-04 LAB — CBC
HCT: 38.5 % — ABNORMAL LOW (ref 39.0–52.0)
Hemoglobin: 13.1 g/dL (ref 13.0–17.0)
MCH: 30.6 pg (ref 26.0–34.0)
MCHC: 34 g/dL (ref 30.0–36.0)
MCV: 90 fL (ref 78.0–100.0)
Platelets: 209 10*3/uL (ref 150–400)
RBC: 4.28 MIL/uL (ref 4.22–5.81)
RDW: 13.8 % (ref 11.5–15.5)
WBC: 6.7 10*3/uL (ref 4.0–10.5)

## 2014-09-04 LAB — PROTIME-INR
INR: 1.1 (ref 0.00–1.49)
Prothrombin Time: 14.3 seconds (ref 11.6–15.2)

## 2014-09-04 LAB — PREPARE RBC (CROSSMATCH)

## 2014-09-04 LAB — MRSA PCR SCREENING: MRSA BY PCR: NEGATIVE

## 2014-09-04 LAB — APTT: aPTT: 35 seconds (ref 24–37)

## 2014-09-04 LAB — ABO/RH: ABO/RH(D): A POS

## 2014-09-04 SURGERY — OPEN REDUCTION INTERNAL FIXATION (ORIF) ACETABULAR FRACTURE
Anesthesia: General | Laterality: Right

## 2014-09-04 MED ORDER — DEXMEDETOMIDINE BOLUS VIA INFUSION
INTRAVENOUS | Status: DC | PRN
  Administered 2014-09-04 (×3): 12 ug via INTRAVENOUS
  Administered 2014-09-04: 4 ug via INTRAVENOUS

## 2014-09-04 MED ORDER — CEFAZOLIN SODIUM-DEXTROSE 2-3 GM-% IV SOLR
2.0000 g | Freq: Three times a day (TID) | INTRAVENOUS | Status: AC
  Administered 2014-09-04 – 2014-09-05 (×3): 2 g via INTRAVENOUS
  Filled 2014-09-04 (×3): qty 50

## 2014-09-04 MED ORDER — LACTATED RINGERS IV SOLN
INTRAVENOUS | Status: DC | PRN
  Administered 2014-09-04 (×2): via INTRAVENOUS

## 2014-09-04 MED ORDER — PROPOFOL 10 MG/ML IV BOLUS
INTRAVENOUS | Status: AC
  Filled 2014-09-04: qty 20

## 2014-09-04 MED ORDER — PROPOFOL 10 MG/ML IV BOLUS
INTRAVENOUS | Status: DC | PRN
  Administered 2014-09-04: 150 mg via INTRAVENOUS

## 2014-09-04 MED ORDER — GLYCOPYRROLATE 0.2 MG/ML IJ SOLN
INTRAMUSCULAR | Status: AC
  Filled 2014-09-04: qty 3

## 2014-09-04 MED ORDER — ROCURONIUM BROMIDE 50 MG/5ML IV SOLN
INTRAVENOUS | Status: AC
  Filled 2014-09-04: qty 1

## 2014-09-04 MED ORDER — ACETAMINOPHEN 10 MG/ML IV SOLN
INTRAVENOUS | Status: DC | PRN
  Administered 2014-09-04: 1000 mg via INTRAVENOUS

## 2014-09-04 MED ORDER — CEFAZOLIN SODIUM-DEXTROSE 2-3 GM-% IV SOLR
INTRAVENOUS | Status: AC
  Filled 2014-09-04: qty 50

## 2014-09-04 MED ORDER — MIDAZOLAM HCL 2 MG/2ML IJ SOLN
INTRAMUSCULAR | Status: AC
  Filled 2014-09-04: qty 2

## 2014-09-04 MED ORDER — SODIUM CHLORIDE 0.9 % IR SOLN
Status: DC | PRN
  Administered 2014-09-04: 3000 mL

## 2014-09-04 MED ORDER — FENTANYL CITRATE 0.05 MG/ML IJ SOLN
INTRAMUSCULAR | Status: AC
  Filled 2014-09-04: qty 5

## 2014-09-04 MED ORDER — NEOSTIGMINE METHYLSULFATE 10 MG/10ML IV SOLN
INTRAVENOUS | Status: DC | PRN
  Administered 2014-09-04: 4 mg via INTRAVENOUS

## 2014-09-04 MED ORDER — FENTANYL CITRATE 0.05 MG/ML IJ SOLN
INTRAMUSCULAR | Status: DC | PRN
  Administered 2014-09-04: 50 ug via INTRAVENOUS
  Administered 2014-09-04: 25 ug via INTRAVENOUS
  Administered 2014-09-04: 50 ug via INTRAVENOUS
  Administered 2014-09-04: 25 ug via INTRAVENOUS
  Administered 2014-09-04 (×2): 100 ug via INTRAVENOUS
  Administered 2014-09-04 (×2): 50 ug via INTRAVENOUS
  Administered 2014-09-04 (×2): 25 ug via INTRAVENOUS
  Administered 2014-09-04: 100 ug via INTRAVENOUS
  Administered 2014-09-04 (×3): 50 ug via INTRAVENOUS

## 2014-09-04 MED ORDER — LACTATED RINGERS IV SOLN
INTRAVENOUS | Status: DC | PRN
  Administered 2014-09-04: 13:00:00 via INTRAVENOUS

## 2014-09-04 MED ORDER — CEFAZOLIN SODIUM-DEXTROSE 2-3 GM-% IV SOLR
2.0000 g | Freq: Once | INTRAVENOUS | Status: AC
  Administered 2014-09-04: 2 g via INTRAVENOUS

## 2014-09-04 MED ORDER — FENTANYL CITRATE 0.05 MG/ML IJ SOLN
INTRAMUSCULAR | Status: AC
  Filled 2014-09-04: qty 2

## 2014-09-04 MED ORDER — MEPERIDINE HCL 25 MG/ML IJ SOLN: 6.2500 mg | INTRAMUSCULAR | Status: DC | PRN

## 2014-09-04 MED ORDER — ARTIFICIAL TEARS OP OINT
TOPICAL_OINTMENT | OPHTHALMIC | Status: DC | PRN
  Administered 2014-09-04: 1 via OPHTHALMIC

## 2014-09-04 MED ORDER — MIDAZOLAM HCL 5 MG/5ML IJ SOLN
INTRAMUSCULAR | Status: DC | PRN
  Administered 2014-09-04 (×2): 1 mg via INTRAVENOUS

## 2014-09-04 MED ORDER — ROCURONIUM BROMIDE 100 MG/10ML IV SOLN
INTRAVENOUS | Status: DC | PRN
  Administered 2014-09-04: 25 mg via INTRAVENOUS
  Administered 2014-09-04: 10 mg via INTRAVENOUS
  Administered 2014-09-04: 50 mg via INTRAVENOUS
  Administered 2014-09-04: 20 mg via INTRAVENOUS
  Administered 2014-09-04: 30 mg via INTRAVENOUS
  Administered 2014-09-04: 25 mg via INTRAVENOUS

## 2014-09-04 MED ORDER — ONDANSETRON HCL 4 MG/2ML IJ SOLN
INTRAMUSCULAR | Status: AC
  Filled 2014-09-04: qty 2

## 2014-09-04 MED ORDER — GLYCOPYRROLATE 0.2 MG/ML IJ SOLN
INTRAMUSCULAR | Status: DC | PRN
  Administered 2014-09-04: 0.6 mg via INTRAVENOUS

## 2014-09-04 MED ORDER — ONDANSETRON HCL 4 MG/2ML IJ SOLN
INTRAMUSCULAR | Status: DC | PRN
  Administered 2014-09-04: 4 mg via INTRAVENOUS

## 2014-09-04 MED ORDER — PHENYLEPHRINE 40 MCG/ML (10ML) SYRINGE FOR IV PUSH (FOR BLOOD PRESSURE SUPPORT)
PREFILLED_SYRINGE | INTRAVENOUS | Status: AC
  Filled 2014-09-04: qty 10

## 2014-09-04 MED ORDER — ROCURONIUM BROMIDE 50 MG/5ML IV SOLN
INTRAVENOUS | Status: AC
  Filled 2014-09-04: qty 3

## 2014-09-04 MED ORDER — LIDOCAINE HCL (CARDIAC) 20 MG/ML IV SOLN
INTRAVENOUS | Status: DC | PRN
  Administered 2014-09-04: 80 mg via INTRAVENOUS

## 2014-09-04 MED ORDER — DEXMEDETOMIDINE HCL IN NACL 200 MCG/50ML IV SOLN
INTRAVENOUS | Status: AC
  Filled 2014-09-04: qty 50

## 2014-09-04 MED ORDER — PROMETHAZINE HCL 25 MG/ML IJ SOLN: 6.2500 mg | INTRAMUSCULAR | Status: DC | PRN

## 2014-09-04 MED ORDER — 0.9 % SODIUM CHLORIDE (POUR BTL) OPTIME
TOPICAL | Status: DC | PRN
  Administered 2014-09-04: 1000 mL

## 2014-09-04 MED ORDER — FENTANYL CITRATE 0.05 MG/ML IJ SOLN
25.0000 ug | INTRAMUSCULAR | Status: DC | PRN
  Administered 2014-09-04: 50 ug via INTRAVENOUS

## 2014-09-04 MED ORDER — LACTATED RINGERS IV SOLN
INTRAVENOUS | Status: DC
  Administered 2014-09-04: 12:00:00 via INTRAVENOUS

## 2014-09-04 SURGICAL SUPPLY — 86 items
APPLIER CLIP 11 MED OPEN (CLIP) ×2
APPLIER CLIP 13 LRG OPEN (CLIP) ×2
BIT DRILL AO MATTA 2.5MX230M (BIT) ×1 IMPLANT
BIT DRILL TWST MATTA 3.5MX195M (BIT) ×1 IMPLANT
BLADE SURG ROTATE 9660 (MISCELLANEOUS) IMPLANT
BRUSH SCRUB DISP (MISCELLANEOUS) ×4 IMPLANT
CLIP APPLIE 11 MED OPEN (CLIP) ×1 IMPLANT
CLIP APPLIE 13 LRG OPEN (CLIP) ×1 IMPLANT
CLSR STERI-STRIP ANTIMIC 1/2X4 (GAUZE/BANDAGES/DRESSINGS) ×2 IMPLANT
COVER SURGICAL LIGHT HANDLE (MISCELLANEOUS) ×2 IMPLANT
DRAIN CHANNEL 10F 3/8 F FF (DRAIN) IMPLANT
DRAIN CHANNEL 15F RND FF W/TCR (WOUND CARE) IMPLANT
DRAPE C-ARM 42X72 X-RAY (DRAPES) IMPLANT
DRAPE C-ARMOR (DRAPES) ×2 IMPLANT
DRAPE IMP U-DRAPE 54X76 (DRAPES) ×2 IMPLANT
DRAPE INCISE IOBAN 66X45 STRL (DRAPES) IMPLANT
DRAPE INCISE IOBAN 85X60 (DRAPES) ×4 IMPLANT
DRAPE ORTHO SPLIT 77X108 STRL (DRAPES) ×2
DRAPE SURG ORHT 6 SPLT 77X108 (DRAPES) ×2 IMPLANT
DRAPE U-SHAPE 47X51 STRL (DRAPES) ×2 IMPLANT
DRILL BIT AO MATTA 2.5MX230M (BIT) ×2
DRILL TWIST AO MATTA 3.5MX195M (BIT) ×2
DRSG ADAPTIC 3X8 NADH LF (GAUZE/BANDAGES/DRESSINGS) ×2 IMPLANT
DRSG MEPILEX BORDER 4X12 (GAUZE/BANDAGES/DRESSINGS) ×2 IMPLANT
DRSG PAD ABDOMINAL 8X10 ST (GAUZE/BANDAGES/DRESSINGS) ×2 IMPLANT
ELECT BLADE 6.5 EXT (BLADE) ×2 IMPLANT
ELECT REM PT RETURN 9FT ADLT (ELECTROSURGICAL) ×2
ELECTRODE REM PT RTRN 9FT ADLT (ELECTROSURGICAL) ×1 IMPLANT
EVACUATOR 1/8 PVC DRAIN (DRAIN) ×2 IMPLANT
EVACUATOR SILICONE 100CC (DRAIN) ×2 IMPLANT
GAUZE SPONGE 4X4 12PLY STRL (GAUZE/BANDAGES/DRESSINGS) ×2 IMPLANT
GAUZE SPONGE 4X4 16PLY XRAY LF (GAUZE/BANDAGES/DRESSINGS) ×2 IMPLANT
GLOVE BIO SURGEON STRL SZ7.5 (GLOVE) ×2 IMPLANT
GLOVE BIO SURGEON STRL SZ8 (GLOVE) ×2 IMPLANT
GLOVE BIOGEL PI IND STRL 7.5 (GLOVE) ×1 IMPLANT
GLOVE BIOGEL PI IND STRL 8 (GLOVE) ×1 IMPLANT
GLOVE BIOGEL PI INDICATOR 7.5 (GLOVE) ×1
GLOVE BIOGEL PI INDICATOR 8 (GLOVE) ×1
GOWN STRL REUS W/ TWL LRG LVL3 (GOWN DISPOSABLE) ×2 IMPLANT
GOWN STRL REUS W/ TWL XL LVL3 (GOWN DISPOSABLE) ×1 IMPLANT
GOWN STRL REUS W/TWL 2XL LVL3 (GOWN DISPOSABLE) ×2 IMPLANT
GOWN STRL REUS W/TWL LRG LVL3 (GOWN DISPOSABLE) ×2
GOWN STRL REUS W/TWL XL LVL3 (GOWN DISPOSABLE) ×1
HANDPIECE INTERPULSE COAX TIP (DISPOSABLE)
K-WIRE 3.2X150 (WIRE) ×4
KIT BASIN OR (CUSTOM PROCEDURE TRAY) ×2 IMPLANT
KIT ROOM TURNOVER OR (KITS) ×2 IMPLANT
KWIRE 3.2X150 (WIRE) ×2 IMPLANT
LIGHT ORTHO (MISCELLANEOUS) ×4 IMPLANT
LOOP VESSEL MAXI BLUE (MISCELLANEOUS) IMPLANT
MANIFOLD NEPTUNE II (INSTRUMENTS) ×2 IMPLANT
NEEDLE MAYO TROCAR (NEEDLE) IMPLANT
NS IRRIG 1000ML POUR BTL (IV SOLUTION) ×2 IMPLANT
PACK TOTAL JOINT (CUSTOM PROCEDURE TRAY) ×2 IMPLANT
PACK UNIVERSAL I (CUSTOM PROCEDURE TRAY) ×2 IMPLANT
PAD ARMBOARD 7.5X6 YLW CONV (MISCELLANEOUS) ×4 IMPLANT
PIPE LIGHT STORZ FITTING (MISCELLANEOUS) ×2 IMPLANT
PLATE SUPRAPECTINEAL PELVIS (Plate) ×2 IMPLANT
RETRIEVER SUT HEWSON (MISCELLANEOUS) ×2 IMPLANT
SCREW 3.5X46MM (Screw) ×2 IMPLANT
SCREW CORT 2.5XFT 44X3.5XST (Screw) ×1 IMPLANT
SCREW CORTEX ST MATTA 3.5X30MM (Screw) ×4 IMPLANT
SCREW CORTEX ST MATTA 3.5X34MM (Screw) ×4 IMPLANT
SCREW CORTEX ST MATTA 3.5X38M (Screw) ×4 IMPLANT
SCREW CORTEX ST MATTA 3.5X40MM (Screw) ×2 IMPLANT
SCREW CORTEX ST MATTA 3.5X50MM (Screw) ×2 IMPLANT
SCREW CORTEX ST MATTA 3.5X85MM (Screw) ×2 IMPLANT
SCREW CORTICAL 3.5X44MM (Screw) ×1 IMPLANT
SET HNDPC FAN SPRY TIP SCT (DISPOSABLE) IMPLANT
SPONGE GAUZE 4X4 12PLY STER LF (GAUZE/BANDAGES/DRESSINGS) ×2 IMPLANT
SPONGE LAP 18X18 X RAY DECT (DISPOSABLE) ×6 IMPLANT
STAPLER VISISTAT 35W (STAPLE) ×2 IMPLANT
STRIP CLOSURE SKIN 1/2X4 (GAUZE/BANDAGES/DRESSINGS) ×2 IMPLANT
SUCTION FRAZIER TIP 10 FR DISP (SUCTIONS) ×2 IMPLANT
SUT FIBERWIRE #2 38 T-5 BLUE (SUTURE)
SUT VIC AB 0 CT1 27 (SUTURE) ×1
SUT VIC AB 0 CT1 27XBRD ANBCTR (SUTURE) ×1 IMPLANT
SUT VIC AB 1 CT1 18XCR BRD 8 (SUTURE) ×1 IMPLANT
SUT VIC AB 1 CT1 8-18 (SUTURE) ×1
SUT VIC AB 2-0 CT1 27 (SUTURE) ×1
SUT VIC AB 2-0 CT1 TAPERPNT 27 (SUTURE) ×1 IMPLANT
SUTURE FIBERWR #2 38 T-5 BLUE (SUTURE) IMPLANT
TOWEL OR 17X24 6PK STRL BLUE (TOWEL DISPOSABLE) ×2 IMPLANT
TOWEL OR 17X26 10 PK STRL BLUE (TOWEL DISPOSABLE) ×4 IMPLANT
TRAY FOLEY CATH 16FRSI W/METER (SET/KITS/TRAYS/PACK) ×2 IMPLANT
WATER STERILE IRR 1000ML POUR (IV SOLUTION) IMPLANT

## 2014-09-04 NOTE — Anesthesia Postprocedure Evaluation (Signed)
  Anesthesia Post-op Note  Patient: [name]  Procedure(s) Performed: Procedure(s): OPEN REDUCTION INTERNAL FIXATION (ORIF) RIGHT ACETABULAR FRACTURE (Right)  Patient Location: PACU  Anesthesia Type:General  Level of Consciousness: awake and alert   Airway and Oxygen Therapy: Patient Spontanous Breathing  Post-op Pain: mild  Post-op Assessment: Post-op Vital signs reviewed, Patient's Cardiovascular Status Stable and Respiratory Function Stable  Post-op Vital Signs: Reviewed  Filed Vitals:   09/04/14 1800  BP:   Pulse: 83  Temp:   Resp: 16    Complications: No apparent anesthesia complications

## 2014-09-04 NOTE — Anesthesia Preprocedure Evaluation (Addendum)
Anesthesia Evaluation  Patient identified by MRN, date of birth, ID band Patient awake    Reviewed: Allergy & Precautions, NPO status , Patient's Chart, lab work & pertinent test results, reviewed documented beta blocker date and time   Airway Mallampati: II   Neck ROM: Full    Dental  (+) Partial Upper, Poor Dentition, Dental Advisory Given   Pulmonary Current Smoker,  breath sounds clear to auscultation        Cardiovascular negative cardio ROS  Rhythm:Regular     Neuro/Psych negative neurological ROS  negative psych ROS   GI/Hepatic negative GI ROS, Neg liver ROS,   Endo/Other  negative endocrine ROS  Renal/GU negative Renal ROS     Musculoskeletal   Abdominal (+)  Abdomen: soft.    Peds  Hematology 13/39 H/H   Anesthesia Other Findings   Reproductive/Obstetrics                            Anesthesia Physical Anesthesia Plan  ASA: II  Anesthesia Plan: General   Post-op Pain Management:    Induction: Intravenous  Airway Management Planned: Oral ETT  Additional Equipment:   Intra-op Plan:   Post-operative Plan: Extubation in OR  Informed Consent: I have reviewed the patients History and Physical, chart, labs and discussed the procedure including the risks, benefits and alternatives for the proposed anesthesia with the patient or authorized representative who has indicated his/her understanding and acceptance.     Plan Discussed with:   Anesthesia Plan Comments: (2nd IV if sblood loss significant)        Anesthesia Quick Evaluation
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
## 2014-09-04 NOTE — Anesthesia Procedure Notes (Signed)
Procedure Name: Intubation Date/Time: 09/04/2014 1:10 PM Performed by: Leonel Ramsay'LAUGHLIN, Leidy Massar H Pre-anesthesia Checklist: Patient identified, Timeout performed, Emergency Drugs available, Suction available and Patient being monitored Patient Re-evaluated:Patient Re-evaluated prior to inductionOxygen Delivery Method: Circle system utilized Preoxygenation: Pre-oxygenation with 100% oxygen Intubation Type: IV induction Ventilation: Mask ventilation without difficulty and Oral airway inserted - appropriate to patient size Laryngoscope Size: Mac and 4 Grade View: Grade I Tube type: Oral Tube size: 7.5 mm Number of attempts: 1 Airway Equipment and Method: Stylet and Oral airway Placement Confirmation: ETT inserted through vocal cords under direct vision,  positive ETCO2 and breath sounds checked- equal and bilateral Secured at: 23 cm Tube secured with: Tape Dental Injury: Teeth and Oropharynx as per pre-operative assessment
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
## 2014-09-04 NOTE — Transfer of Care (Signed)
Immediate Anesthesia Transfer of Care Note  Patient: [name]  Procedure(s) Performed: Procedure(s): OPEN REDUCTION INTERNAL FIXATION (ORIF) RIGHT ACETABULAR FRACTURE (Right)  Patient Location: PACU  Anesthesia Type:General  Level of Consciousness: awake, alert  and oriented  Airway & Oxygen Therapy: Patient Spontanous Breathing and Patient connected to nasal cannula oxygen  Post-op Assessment: Report given to PACU RN and Post -op Vital signs reviewed and stable  Post vital signs: Reviewed and stable  Complications: No apparent anesthesia complications

## 2014-09-05 ENCOUNTER — Encounter (HOSPITAL_COMMUNITY): Payer: Self-pay | Admitting: Orthopedic Surgery

## 2014-09-05 DIAGNOSIS — S32401A Unspecified fracture of right acetabulum, initial encounter for closed fracture: Secondary | ICD-10-CM

## 2014-09-05 DIAGNOSIS — S32008A Other fracture of unspecified lumbar vertebra, initial encounter for closed fracture: Secondary | ICD-10-CM

## 2014-09-05 DIAGNOSIS — S2241XA Multiple fractures of ribs, right side, initial encounter for closed fracture: Secondary | ICD-10-CM

## 2014-09-05 LAB — CBC
HEMATOCRIT: 34.9 % — AB (ref 39.0–52.0)
Hemoglobin: 11.8 g/dL — ABNORMAL LOW (ref 13.0–17.0)
MCH: 30.3 pg (ref 26.0–34.0)
MCHC: 33.8 g/dL (ref 30.0–36.0)
MCV: 89.7 fL (ref 78.0–100.0)
PLATELETS: 232 10*3/uL (ref 150–400)
RBC: 3.89 MIL/uL — ABNORMAL LOW (ref 4.22–5.81)
RDW: 13.9 % (ref 11.5–15.5)
WBC: 9.2 10*3/uL (ref 4.0–10.5)

## 2014-09-05 LAB — BASIC METABOLIC PANEL
Anion gap: 4 — ABNORMAL LOW (ref 5–15)
BUN: 9 mg/dL (ref 6–23)
CALCIUM: 7.9 mg/dL — AB (ref 8.4–10.5)
CO2: 29 mmol/L (ref 19–32)
Chloride: 96 mEq/L (ref 96–112)
Creatinine, Ser: 0.94 mg/dL (ref 0.50–1.35)
GFR calc Af Amer: >90 mL/min (ref >90)
GLUCOSE: 115 mg/dL — AB (ref 70–99)
Potassium: 3.7 mmol/L (ref 3.5–5.1)
Sodium: 129 mmol/L — ABNORMAL LOW (ref 135–145)

## 2014-09-05 MED ORDER — PATIENT'S GUIDE TO USING COUMADIN BOOK
Freq: Once | Status: AC
  Administered 2014-09-05: 19:00:00
  Filled 2014-09-05: qty 1

## 2014-09-05 MED ORDER — WARFARIN VIDEO: Freq: Once | Status: DC

## 2014-09-05 MED ORDER — WARFARIN - PHARMACIST DOSING INPATIENT: Freq: Every day | Status: DC

## 2014-09-05 MED ORDER — ENOXAPARIN SODIUM 30 MG/0.3ML ~~LOC~~ SOLN
30.0000 mg | Freq: Two times a day (BID) | SUBCUTANEOUS | Status: DC
  Administered 2014-09-05 – 2014-09-06 (×3): 30 mg via SUBCUTANEOUS
  Filled 2014-09-05 (×4): qty 0.3

## 2014-09-05 MED ORDER — OXYCODONE HCL 5 MG PO TABS
10.0000 mg | ORAL_TABLET | ORAL | Status: DC | PRN
  Administered 2014-09-05 – 2014-09-06 (×4): 20 mg via ORAL
  Filled 2014-09-05 (×4): qty 4

## 2014-09-05 MED ORDER — WARFARIN SODIUM 10 MG PO TABS
10.0000 mg | ORAL_TABLET | Freq: Once | ORAL | Status: AC
  Administered 2014-09-05: 10 mg via ORAL
  Filled 2014-09-05: qty 1

## 2014-09-05 MED ORDER — TRAMADOL HCL 50 MG PO TABS
100.0000 mg | ORAL_TABLET | Freq: Four times a day (QID) | ORAL | Status: DC
  Administered 2014-09-05 – 2014-09-06 (×6): 100 mg via ORAL
  Filled 2014-09-05 (×6): qty 2

## 2014-09-05 NOTE — Progress Notes (Signed)
Patient has a low grade fever 100.4. IS use was encouraged; patient is pulling 2500 on the IS. Will continue to monitor.
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
## 2014-09-05 NOTE — Progress Notes (Signed)
Rehab Admissions Coordinator Note:  Patient was screened by Clois DupesBoyette, Rumi Taras Godwin for appropriateness for an Inpatient Acute Rehab Consult per PT recommendation At this time, we are recommending Inpatient Rehab consult.  Clois DupesBoyette, Reinhold Rickey Godwin 09/05/2014, 2:26 PM  I can be reached at [phone].

## 2014-09-05 NOTE — Progress Notes (Signed)
Overhead trapeze bar was ordered. At this time, there are no trapeze bars available; however, the Ortho Techs will put one on once one is available.
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
## 2014-09-05 NOTE — Progress Notes (Signed)
UR completed.  Marsa Matteo, RN BSN MHA CCM Trauma/Neuro ICU Case Manager [phone]  

## 2014-09-06 ENCOUNTER — Inpatient Hospital Stay (HOSPITAL_COMMUNITY)
Admission: RE | Admit: 2014-09-06 | Discharge: 2014-09-10 | DRG: 560 | Disposition: A | Discharge status: Home Health | Payer: No Typology Code available for payment source | Source: Intra-hospital | Attending: Physical Medicine & Rehabilitation | Admitting: Physical Medicine & Rehabilitation

## 2014-09-06 DIAGNOSIS — S32008S Other fracture of unspecified lumbar vertebra, sequela: Secondary | ICD-10-CM

## 2014-09-06 DIAGNOSIS — S2241XD Multiple fractures of ribs, right side, subsequent encounter for fracture with routine healing: Secondary | ICD-10-CM

## 2014-09-06 DIAGNOSIS — T1490XA Injury, unspecified, initial encounter: Secondary | ICD-10-CM

## 2014-09-06 DIAGNOSIS — K5900 Constipation, unspecified: Secondary | ICD-10-CM | POA: Diagnosis present

## 2014-09-06 DIAGNOSIS — S32401D Unspecified fracture of right acetabulum, subsequent encounter for fracture with routine healing: Principal | ICD-10-CM

## 2014-09-06 DIAGNOSIS — E871 Hypo-osmolality and hyponatremia: Secondary | ICD-10-CM | POA: Diagnosis present

## 2014-09-06 DIAGNOSIS — S32019D Unspecified fracture of first lumbar vertebra, subsequent encounter for fracture with routine healing: Secondary | ICD-10-CM

## 2014-09-06 DIAGNOSIS — S32401S Unspecified fracture of right acetabulum, sequela: Secondary | ICD-10-CM

## 2014-09-06 DIAGNOSIS — S32401A Unspecified fracture of right acetabulum, initial encounter for closed fracture: Secondary | ICD-10-CM | POA: Diagnosis present

## 2014-09-06 DIAGNOSIS — T1490 Injury, unspecified: Secondary | ICD-10-CM

## 2014-09-06 DIAGNOSIS — F1010 Alcohol abuse, uncomplicated: Secondary | ICD-10-CM | POA: Diagnosis present

## 2014-09-06 DIAGNOSIS — S32009A Unspecified fracture of unspecified lumbar vertebra, initial encounter for closed fracture: Secondary | ICD-10-CM | POA: Diagnosis present

## 2014-09-06 LAB — CBC
HEMATOCRIT: 36.5 % — AB (ref 39.0–52.0)
HEMOGLOBIN: 12.6 g/dL — AB (ref 13.0–17.0)
MCH: 31.1 pg (ref 26.0–34.0)
MCHC: 34.5 g/dL (ref 30.0–36.0)
MCV: 90.1 fL (ref 78.0–100.0)
Platelets: 249 10*3/uL (ref 150–400)
RBC: 4.05 MIL/uL — AB (ref 4.22–5.81)
RDW: 13.8 % (ref 11.5–15.5)
WBC: 11.7 10*3/uL — ABNORMAL HIGH (ref 4.0–10.5)

## 2014-09-06 LAB — PROTIME-INR
INR: 1.17 (ref 0.00–1.49)
Prothrombin Time: 15 s (ref 11.6–15.2)

## 2014-09-06 LAB — BASIC METABOLIC PANEL
Anion gap: 6 (ref 5–15)
BUN: 9 mg/dL (ref 6–23)
CO2: 30 mmol/L (ref 19–32)
Calcium: 8.4 mg/dL (ref 8.4–10.5)
Chloride: 92 mEq/L — ABNORMAL LOW (ref 96–112)
Creatinine, Ser: 1.03 mg/dL (ref 0.50–1.35)
GFR calc non Af Amer: >90 mL/min (ref >90)
GLUCOSE: 132 mg/dL — AB (ref 70–99)
POTASSIUM: 4.3 mmol/L (ref 3.5–5.1)
Sodium: 128 mmol/L — ABNORMAL LOW (ref 135–145)

## 2014-09-06 LAB — POCT I-STAT 4, (NA,K, GLUC, HGB,HCT)
GLUCOSE: 118 mg/dL — AB (ref 70–99)
HEMATOCRIT: 39 % (ref 39.0–52.0)
Hemoglobin: 13.3 g/dL (ref 13.0–17.0)
Potassium: 4.5 mmol/L (ref 3.5–5.1)
Sodium: 133 mmol/L — ABNORMAL LOW (ref 135–145)

## 2014-09-06 LAB — OSMOLALITY: Osmolality: 282 mOsm/kg (ref 275–300)

## 2014-09-06 MED ORDER — WARFARIN SODIUM 10 MG PO TABS
10.0000 mg | ORAL_TABLET | Freq: Once | ORAL | Status: DC
Start: 2014-09-07 — End: 2014-09-06

## 2014-09-06 MED ORDER — DOCUSATE SODIUM 100 MG PO CAPS
100.0000 mg | ORAL_CAPSULE | Freq: Two times a day (BID) | ORAL | Status: DC
  Administered 2014-09-06 – 2014-09-10 (×8): 100 mg via ORAL
  Filled 2014-09-06 (×10): qty 1

## 2014-09-06 MED ORDER — WARFARIN - PHARMACIST DOSING INPATIENT: Freq: Every day | Status: DC

## 2014-09-06 MED ORDER — POLYETHYLENE GLYCOL 3350 17 G PO PACK
17.0000 g | PACK | Freq: Every day | ORAL | Status: DC
  Administered 2014-09-07 – 2014-09-10 (×4): 17 g via ORAL
  Filled 2014-09-06 (×5): qty 1

## 2014-09-06 MED ORDER — OXYCODONE HCL 5 MG PO TABS
10.0000 mg | ORAL_TABLET | ORAL | Status: DC | PRN
  Administered 2014-09-07 – 2014-09-08 (×2): 10 mg via ORAL
  Filled 2014-09-06 (×2): qty 2

## 2014-09-06 MED ORDER — ENOXAPARIN SODIUM 30 MG/0.3ML ~~LOC~~ SOLN
30.0000 mg | Freq: Two times a day (BID) | SUBCUTANEOUS | Status: DC
  Administered 2014-09-06 – 2014-09-07 (×2): 30 mg via SUBCUTANEOUS
  Filled 2014-09-06 (×5): qty 0.3

## 2014-09-06 MED ORDER — SORBITOL 70 % SOLN
30.0000 mL | Freq: Every day | Status: DC | PRN
  Administered 2014-09-06: 30 mL via ORAL
  Filled 2014-09-06: qty 30

## 2014-09-06 MED ORDER — TRAMADOL HCL 50 MG PO TABS
100.0000 mg | ORAL_TABLET | Freq: Four times a day (QID) | ORAL | Status: DC
  Administered 2014-09-07 – 2014-09-10 (×16): 100 mg via ORAL
  Filled 2014-09-06 (×16): qty 2

## 2014-09-06 MED ORDER — BISACODYL 10 MG RE SUPP: 10.0000 mg | Freq: Every day | RECTAL | Status: DC | PRN

## 2014-09-06 MED ORDER — ACETAMINOPHEN 325 MG PO TABS
325.0000 mg | ORAL_TABLET | ORAL | Status: DC | PRN
  Administered 2014-09-09: 650 mg via ORAL
  Filled 2014-09-06: qty 2

## 2014-09-06 MED ORDER — METOCLOPRAMIDE HCL 5 MG/ML IJ SOLN
10.0000 mg | Freq: Three times a day (TID) | INTRAMUSCULAR | Status: DC
  Administered 2014-09-06 (×2): 10 mg via INTRAVENOUS
  Filled 2014-09-06 (×2): qty 2

## 2014-09-06 MED ORDER — METHOCARBAMOL 500 MG PO TABS
500.0000 mg | ORAL_TABLET | Freq: Four times a day (QID) | ORAL | Status: DC | PRN
  Administered 2014-09-07: 500 mg via ORAL
  Filled 2014-09-06: qty 1

## 2014-09-06 MED ORDER — TRAZODONE HCL 50 MG PO TABS: 50.0000 mg | ORAL_TABLET | Freq: Every evening | ORAL | Status: DC | PRN

## 2014-09-06 MED ORDER — ONDANSETRON HCL 4 MG PO TABS: 4.0000 mg | ORAL_TABLET | Freq: Four times a day (QID) | ORAL | Status: DC | PRN

## 2014-09-06 MED ORDER — OXYCODONE HCL 5 MG PO TABS: 10.0000 mg | ORAL_TABLET | ORAL | Status: DC | PRN

## 2014-09-06 MED ORDER — WARFARIN SODIUM 10 MG PO TABS
10.0000 mg | ORAL_TABLET | Freq: Once | ORAL | Status: DC
  Administered 2014-09-06: 10 mg via ORAL
  Filled 2014-09-06: qty 1

## 2014-09-06 MED ORDER — ONDANSETRON HCL 4 MG/2ML IJ SOLN: 4.0000 mg | Freq: Four times a day (QID) | INTRAMUSCULAR | Status: DC | PRN
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
## 2014-09-06 NOTE — Progress Notes (Signed)
Rehab admissions - I met with patient and his mother.  Mom is in agreement to inpatient rehab admission.  Patient up in chair, but is sleepy today.  Patient has been cleared by trauma for inpatient rehab admission.  Bed available and will admit to acute inpatient rehab today.  Call me for questions.  #[phone]
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
## 2014-09-06 NOTE — Progress Notes (Signed)
Report called to RN on . Patient transferred to room 4 with personal belongings and chart.
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
## 2014-09-06 NOTE — Discharge Instructions (Signed)
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
## 2014-09-06 NOTE — Progress Notes (Signed)
Patient information reviewed and entered into eRehab system by Katty Fretwell, RN, CRRN, PPS Coordinator.  Information including medical coding and functional independence measure will be reviewed and updated through discharge.    
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
## 2014-09-07 ENCOUNTER — Encounter (HOSPITAL_COMMUNITY): Payer: Self-pay | Admitting: Occupational Therapy

## 2014-09-07 ENCOUNTER — Inpatient Hospital Stay (HOSPITAL_COMMUNITY): Payer: No Typology Code available for payment source

## 2014-09-07 ENCOUNTER — Inpatient Hospital Stay (HOSPITAL_COMMUNITY): Payer: No Typology Code available for payment source | Admitting: Occupational Therapy

## 2014-09-07 DIAGNOSIS — K567 Ileus, unspecified: Secondary | ICD-10-CM

## 2014-09-07 LAB — TYPE AND SCREEN
ABO/RH(D): A POS
ANTIBODY SCREEN: NEGATIVE
UNIT DIVISION: 0
Unit division: 0

## 2014-09-07 LAB — CBC WITH DIFFERENTIAL/PLATELET
BASOS ABS: 0 10*3/uL (ref 0.0–0.1)
Basophils Relative: 0 % (ref 0–1)
Eosinophils Absolute: 0.1 10*3/uL (ref 0.0–0.7)
Eosinophils Relative: 1 % (ref 0–5)
HCT: 35 % — ABNORMAL LOW (ref 39.0–52.0)
Hemoglobin: 12.2 g/dL — ABNORMAL LOW (ref 13.0–17.0)
Lymphocytes Relative: 14 % (ref 12–46)
Lymphs Abs: 1.4 10*3/uL (ref 0.7–4.0)
MCH: 31.1 pg (ref 26.0–34.0)
MCHC: 34.9 g/dL (ref 30.0–36.0)
MCV: 89.3 fL (ref 78.0–100.0)
MONO ABS: 1.3 10*3/uL — AB (ref 0.1–1.0)
MONOS PCT: 13 % — AB (ref 3–12)
NEUTROS PCT: 71 % (ref 43–77)
Neutro Abs: 6.9 10*3/uL (ref 1.7–7.7)
PLATELETS: 275 10*3/uL (ref 150–400)
RBC: 3.92 MIL/uL — AB (ref 4.22–5.81)
RDW: 13.6 % (ref 11.5–15.5)
WBC: 9.8 10*3/uL (ref 4.0–10.5)

## 2014-09-07 LAB — COMPREHENSIVE METABOLIC PANEL
ALK PHOS: 56 U/L (ref 39–117)
ALT: 14 U/L (ref 0–53)
AST: 21 U/L (ref 0–37)
Albumin: 3.1 g/dL — ABNORMAL LOW (ref 3.5–5.2)
Anion gap: 14 (ref 5–15)
BUN: 9 mg/dL (ref 6–23)
CALCIUM: 8.9 mg/dL (ref 8.4–10.5)
CO2: 30 mmol/L (ref 19–32)
CREATININE: 0.8 mg/dL (ref 0.50–1.35)
Chloride: 91 mEq/L — ABNORMAL LOW (ref 96–112)
GFR calc non Af Amer: >90 mL/min (ref >90)
Glucose, Bld: 116 mg/dL — ABNORMAL HIGH (ref 70–99)
Potassium: 4.1 mmol/L (ref 3.5–5.1)
SODIUM: 135 mmol/L (ref 135–145)
Total Bilirubin: 0.4 mg/dL (ref 0.3–1.2)
Total Protein: 6.9 g/dL (ref 6.0–8.3)

## 2014-09-07 LAB — PROTIME-INR
INR: 2.65 — AB (ref 0.00–1.49)
Prothrombin Time: 28.4 seconds — ABNORMAL HIGH (ref 11.6–15.2)

## 2014-09-07 MED ORDER — SORBITOL 70 % SOLN: 60.0000 mL | Status: AC

## 2014-09-07 NOTE — Progress Notes (Signed)
Occupational Therapy Session Note  Patient Details  Name: [name] MRN: [id number] Date of Birth: 09/29/1975  Today's Date: 09/07/2014 OT Individual Time: [id number] OT Individual Time Calculation (min): 30 min    Short Term Goals: Week 1:  OT Short Term Goal 1 (Week 1): STGs equal to LTGs based on LOS set at modified independent.    Skilled Therapeutic Interventions/Progress Updates:  Upon entering the room, pt supine in bed awaiting therapist arrival. Pt with 3/10 c/o pain in R hip. Pt performing supine >sit with supervision. Pt able to verbalize weight bearing restrictions with 100% accuracy as well as displaying ability to maintain precautions during intervention session. STS from standard bed height with supervision and use of RW. Pt ambulated ~ 6485 ' with RW and supervision to ADL apartment and practiced furniture transfers from sofa, standard bed, and lawn type chair with supervision and use of RW. OT educated and demonstrated transfer into walk in shower with use of RW and maintaining precautions to safely cross the threshold. Pt returned demonstration in and out of shower simulation with supervision. OT educated pt on fall risk within the bathroom and during transfer with pt verbalizing understanding. Pt ambulated ~ 7785' back to room with RW and supervision and sit>supine onto bed in likewise manner. Pt supine in bed with call bell and all needed items within reach upon exiting the room.   Therapy Documentation Precautions:  Precautions Precautions: Fall Restrictions Weight Bearing Restrictions: Yes RLE Weight Bearing: Touchdown weight bearing LLE Weight Bearing: Weight bearing as tolerated Other Position/Activity Restrictions: R rib fx and lumbar transverse process fx no precautions stated from MD  See FIM for current functional status  Therapy/Group: Individual Therapy  Lowella Gripittman, [name] 09/07/2014, 2:54 PM
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
## 2014-09-08 ENCOUNTER — Inpatient Hospital Stay (HOSPITAL_COMMUNITY): Payer: No Typology Code available for payment source

## 2014-09-08 ENCOUNTER — Inpatient Hospital Stay (HOSPITAL_COMMUNITY): Payer: Self-pay | Admitting: Occupational Therapy

## 2014-09-08 ENCOUNTER — Encounter (HOSPITAL_COMMUNITY): Payer: Self-pay | Admitting: Occupational Therapy

## 2014-09-08 DIAGNOSIS — M7989 Other specified soft tissue disorders: Secondary | ICD-10-CM

## 2014-09-08 LAB — PROTIME-INR
INR: 1.88 — AB (ref 0.00–1.49)
PROTHROMBIN TIME: 21.8 s — AB (ref 11.6–15.2)

## 2014-09-08 MED ORDER — METHOCARBAMOL 500 MG PO TABS: 500.0000 mg | ORAL_TABLET | Freq: Four times a day (QID) | ORAL | Status: DC | PRN

## 2014-09-08 MED ORDER — TRAMADOL HCL 50 MG PO TABS: 100.0000 mg | ORAL_TABLET | Freq: Four times a day (QID) | ORAL | Status: DC

## 2014-09-08 MED ORDER — WARFARIN SODIUM 5 MG PO TABS
7.5000 mg | ORAL_TABLET | Freq: Every day | ORAL | Status: DC
  Administered 2014-09-08: 7.5 mg via ORAL
  Filled 2014-09-08 (×2): qty 1.5

## 2014-09-08 MED ORDER — OXYCODONE HCL 10 MG PO TABS: 10.0000 mg | ORAL_TABLET | ORAL | Status: DC | PRN

## 2014-09-08 MED ORDER — WARFARIN SODIUM 5 MG PO TABS: ORAL_TABLET | ORAL | Status: DC

## 2014-09-08 NOTE — Discharge Instructions (Signed)
Inpatient Rehab Discharge Instructions  Jullien Sharma [facility] Discharge date and time: No discharge date for patient encounter.   Activities/Precautions/ Functional Status: Activity: Touchdown weightbearing right lower extremity Diet: regular diet Wound Care: keep wound clean and dry Functional status:  ___ No restrictions     ___ Walk up steps independently ___ 24/7 supervision/assistance   ___ Walk up steps with assistance ___ Intermittent supervision/assistance  ___ Bathe/dress independently ___ Walk with walker     ___ Bathe/dress with assistance ___ Walk Independently    ___ Shower independently _x__ Walk with assistance    ___ Shower with assistance ___ No alcohol     ___ Return to work/school ________    COMMUNITY REFERRALS UPON DISCHARGE:    Home Health:      RN       SW                      Agency:  [facility] Phone: [phone]   Medical Equipment/Items Ordered: wheelchair, cushion, rolling walker, commode and tub seat                                                     Agency/Supplier:  [facility] @ [phone]  Primary Care Follow Up:  [facility]                                              [address] [place]                                              [place], [place]  [id number]                                              [phone]                                                                                                                                    Special Instructions: Home health nurse to check INR on Tuesday, 09/12/2014 for DVT prophylaxis to be completed 10/05/2014   My questions have been answered and I understand these instructions. I will adhere to these goals and the provided educational materials after my discharge from the hospital.  Patient/Caregiver Signature _______________________________ Date __________  Clinician Signature _______________________________________ Date  __________  Please bring this form and your medication list with you  to all your follow-up doctor's appointments.

## 2014-09-08 NOTE — Discharge Summary (Signed)
Discharge summary job [id number]

## 2014-09-08 NOTE — Progress Notes (Signed)
Physical Therapy Discharge Summary  Patient Details  Name: [name] MRN: [id number] Date of Birth: August 10, 1976   Patient has met 8 of 8 long term goals due to improved activity tolerance, improved balance, increased strength, increased range of motion, decreased pain, ability to compensate for deficits and functional use of  right lower extremity.  Patient to discharge at a household distance ambulatory level Modified Independent with RW and mod I w/c mobility for longer distances. S to mod I level for stair negotiation. Pt is independent to direct care if needed.  Reasons goals not met: n/a - all goals met.  Recommendation:  Patient will benefit from ongoing skilled PT services in home health setting once WB restrictions are adjusted to continue to advance safe functional mobility, address ongoing impairments in gait, ROM, balance, endurance, strength in RLE, and minimize fall risk.  Equipment: RW and 18x18 w/c with basic cushion  Reasons for discharge: treatment goals met  Patient/family agrees with progress made and goals achieved: Yes  PT Discharge Precautions/Restrictions Restrictions Weight Bearing Restrictions: Yes RLE Weight Bearing: Touchdown weight bearing Other Position/Activity Restrictions: R rib fx and lumbar transverse process fx no precautions stated from MD Cognition Overall Cognitive Status: Within Functional Limits for tasks assessed Orientation Level: Oriented X4 Safety/Judgment: Appears intact Sensation Sensation Light Touch: Appears Intact Proprioception: Appears Intact Coordination Gross Motor Movements are Fluid and Coordinated: Yes Fine Motor Movements are Fluid and Coordinated: Yes Motor  Motor Motor: Within Functional Limits  Locomotion  Ambulation Ambulation/Gait Assistance: 6: Modified independent (Device/Increase time)  Trunk/Postural Assessment  Cervical Assessment Cervical Assessment: Within Functional Limits Thoracic  Assessment Thoracic Assessment: Within Functional Limits Lumbar Assessment Lumbar Assessment: Within Functional Limits Postural Control Postural Control: Within Functional Limits  Balance Balance Balance Assessed: Yes Static Sitting Balance Static Sitting - Level of Assistance: 6: Modified independent (Device/Increase time) Dynamic Sitting Balance Dynamic Sitting - Level of Assistance: 6: Modified independent (Device/Increase time) Static Standing Balance Static Standing - Level of Assistance: 6: Modified independent (Device/Increase time) Dynamic Standing Balance Dynamic Standing - Level of Assistance: 6: Modified independent (Device/Increase time) Extremity Assessment      RLE Assessment RLE Assessment: Exceptions to [facility] RLE Strength RLE Overall Strength Comments: 3-/5 a hip; 3+ to 4-/5 at knee and ankle WFL LLE Assessment LLE Assessment: Within Functional Limits  See FIM for current functional status  [facility] 09/08/2014, 3:12 PM
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
## 2014-09-08 NOTE — Progress Notes (Signed)
Occupational Therapy Discharge Summary  Patient Details  Name: [name] MRN: [id number] Date of Birth: [birth year]  Today's Date: 09/08/2014 OT Individual Time: 0175-1025 OT Individual Time Calculation (min): 45 min   Session Note:  Pt ambulated down to the kitchen with the RW.  Min demonstrational cueing to allow for 5-6 inches of space between himself and the front of the walker after stepping.  Overall modified independent for mobility including adhering to his weightbearing precautions.  Educated pt on walker safety in the kitchen and also provided walker bag for use at home as well.  He was able to ambulate around the kitchen and safely replace 5 items that were removed, using the RW modified independent level.  Once finished had him ambulate to the therapy gym and worked on bilateral UE strengthening using the blue theraband.  He performed 1 set of 10 repetitions for shoulder flexion, shoulder abduction, and horizontal abduction.  Returned to room at end of session.    Patient has met 9 of 9 long term goals due to improved activity tolerance, improved balance and ability to compensate for deficits.  Patient to discharge at overall Modified Independent level.  Pt will be discharging home with his mom who works during the day.  No family education performed or warranted at this time. Reasons goals not met: NA  Recommendation:  No post acute OT needs at this time.  Pt is modified independent level for all selfcare.  Will need 3:1, RW, and shower seat in order to maintain TDWBing precautions with ADLs.    Equipment: 3:1, shower seat, RW  Reasons for discharge: treatment goals met and discharge from hospital  Patient/family agrees with progress made and goals achieved: Yes  OT Discharge Precautions/Restrictions  Precautions Precautions: None Precaution Comments: Fall risk is decreasing significantly with practice Restrictions Weight Bearing Restrictions: Yes RLE Weight Bearing:  Touchdown weight bearing LLE Weight Bearing: Weight bearing as tolerated  Pain Pain Assessment Pain Assessment: Faces Pain Score: 0-No pain Faces Pain Scale: Hurts a little bit Pain Type: Acute pain Pain Location: Rib cage Pain Orientation: Right Pain Descriptors / Indicators: Dull;Discomfort Pain Frequency: Intermittent Pain Onset: With Activity Patients Stated Pain Goal: 3 Pain Intervention(s): Repositioned;Ambulation/increased activity;Emotional support ADL  See FIM scale for details  Vision/Perception  Vision- History Baseline Vision/History: No visual deficits Vision- Assessment Vision Assessment?: No apparent visual deficits  Cognition Overall Cognitive Status: Within Functional Limits for tasks assessed Orientation Level: Oriented X4 Attention: Selective Selective Attention: Appears intact Memory: Appears intact Awareness: Appears intact Problem Solving: Appears intact Safety/Judgment: Appears intact Sensation Sensation Light Touch: Appears Intact Stereognosis: Appears Intact Hot/Cold: Appears Intact Proprioception: Appears Intact Coordination Gross Motor Movements are Fluid and Coordinated: Yes Fine Motor Movements are Fluid and Coordinated: Yes Motor  Motor Motor: Within Functional Limits Mobility  Bed Mobility Bed Mobility: Supine to Sit;Sit to Supine Supine to Sit: 6: Modified independent (Device/Increase time) Sit to Supine: 6: Modified independent (Device/Increase time)  Trunk/Postural Assessment  Cervical Assessment Cervical Assessment: Within Functional Limits Thoracic Assessment Thoracic Assessment: Within Functional Limits Lumbar Assessment Lumbar Assessment: Within Functional Limits Postural Control Postural Control: Within Functional Limits  Balance Balance Balance Assessed: Yes Static Sitting Balance Static Sitting - Level of Assistance: 7: Independent Dynamic Sitting Balance Dynamic Sitting - Level of Assistance: 7:  Independent Static Standing Balance Static Standing - Level of Assistance: 6: Modified independent (Device/Increase time) Dynamic Standing Balance Dynamic Standing - Level of Assistance: 6: Modified independent (Device/Increase time) Extremity/Trunk Assessment RUE Assessment RUE  Assessment: Within Functional Limits LUE Assessment LUE Assessment: Within Functional Limits  See FIM for current functional status  Georg Ang OTR/L 09/08/2014, 3:03 PM

## 2014-09-08 NOTE — Progress Notes (Signed)
*  PRELIMINARY RESULTS* Vascular Ultrasound Lower extremity venous duplex has been completed.  Preliminary findings: no evidence of DVT.   Farrel DemarkJill Eunice, RDMS, RVT  09/08/2014, 3:50 PM

## 2014-09-08 NOTE — Progress Notes (Signed)
Physical Therapy Session Note  Patient Details  Name: [name] MRN: [id number] Date of Birth: 06/24/1976  Today's Date: 09/08/2014  Short Term Goals: Week 1:  PT Short Term Goal 1 (Week 1): = LTGs  Session #1: Time: [phone] (60 min) Denies pain currently. Pt in bathroom completing toileting and using RW to exit bathroom at mod I level. Session focused on grad day activities and d/c assessment. Pt performing all functional gait with RW for household distances, bed mobility in ADL apartment and transfers at mod I level. Simulated car transfer performed in preparation for d/c with recommendations for seat adjustment if needed for comfort and ease of bringing RLE into the car - pt verbalized understanding. Stair negotiation up/down 12 steps using bilateral handrails mod I; discussed needing S at home due to set-up of RW at top or bottom of stairs at this time in order to maintain WB precautions. Performed self ROM and stretching to RLE in supine and strengthening exercises for HEP. W/c mobility training using obstacle course to simulate community mobility including turns, propelling over compliant surface, picking up objects from floor while in w/c, and ascending/descending ramp x 3. Pt made mod I in room using RW. Also discussed delaying HHPT services until pt able to increase WB status as pt is independent with HEP and mod I level for mobility at this time - notified CSW.   Session #2: Time: 1400-1430 (30 min) Denies pain. Pt denies any futher questions or concerns in regards to PT for planned d/c this weekend. Focused session on community mobility at w/c level and using RW outdoors on uneven surfaces. Pt managed w/c on and off elevator, over uneven surfaces and through obstacles mod I. Gait outdoors on brick with RW at mod I level x 150' with education on energy conservation techniques for community mobility. Nustep for general strengthening and endurance x 10 min on level 6 within WB  limitations but way of pt using UE's to perform AA movement for RLE.   Therapy Documentation Precautions:  Precautions Precautions: Fall Restrictions Weight Bearing Restrictions: Yes RLE Weight Bearing: Touchdown weight bearing LLE Weight Bearing: Weight bearing as tolerated Other Position/Activity Restrictions: R rib fx and lumbar transverse process fx no precautions stated from MD  See FIM for current functional status  Therapy/Group: Individual Therapy  Karolee StampsGray, Mandeep Ferch [facility] 09/08/2014, 3:11 PM
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
## 2014-09-08 NOTE — Progress Notes (Signed)
Inpatient Rehabilitation Center Individual Statement of Services  Patient Name:  [name]  Date:  09/08/2014  Welcome to the Inpatient Rehabilitation Center.  Our goal is to provide you with an individualized program based on your diagnosis and situation, designed to meet your specific needs.  With this comprehensive rehabilitation program, you will be expected to participate in at least 3 hours of rehabilitation therapies Monday-Friday, with modified therapy programming on the weekends.  Your rehabilitation program will include the following services:  Physical Therapy (PT), Occupational Therapy (OT), 24 hour per day rehabilitation nursing, Case Management (Social Worker), Rehabilitation Medicine, Nutrition Services and Pharmacy Services  Weekly team conferences will be held on Tuesdays to discuss your progress.  Your Social Worker will talk with you frequently to get your input and to update you on team discussions.  Team conferences with you and your family in attendance may also be held.  Expected length of stay:  4-7 days Overall anticipated outcome:  Modified Independent  Depending on your progress and recovery, your program may change. Your Social Worker will coordinate services and will keep you informed of any changes. Your Social Worker's name and contact numbers are listed  below.  The following services may also be recommended but are not provided by the Inpatient Rehabilitation Center:   Driving Evaluations  Home Health Rehabiltiation Services  Outpatient Rehabilitation Services  Vocational Rehabilitation   Arrangements will be made to provide these services after discharge if needed.  Arrangements include referral to agencies that provide these services.  Your insurance has been verified to be:  None at this time Your primary doctor is:  Follow up will be with the orthopedic surgeon, but you can contact [facility] if you need primary  care  Pertinent information will be shared with your doctor and your insurance company.  Social Worker:  Staci AcostaJenny Shelvia Fojtik, LCSW  [phone] or [phone]  Information discussed with and copy given to patient by: Elvera LennoxPrevatt, Oumar Marcott Capps, 09/08/2014, 11:27 PM

## 2014-09-09 LAB — PROTIME-INR
INR: 1.97 — ABNORMAL HIGH (ref 0.00–1.49)
Prothrombin Time: 22.6 seconds — ABNORMAL HIGH (ref 11.6–15.2)

## 2014-09-09 MED ORDER — WARFARIN SODIUM 7.5 MG PO TABS
7.5000 mg | ORAL_TABLET | Freq: Every day | ORAL | Status: DC
  Administered 2014-09-09 – 2014-09-10 (×2): 7.5 mg via ORAL
  Filled 2014-09-09 (×3): qty 1
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
## 2014-09-09 NOTE — Progress Notes (Signed)
Social Work Patient ID: [name], male   DOB: [birth year], 39 y.o.   MRN: [id number]   Pt's therapy team and MD met informally for a team conference due to pt being ready for d/c prior to conference day.  Pt is scheduled for d/c on Sunday 09-10-14.  CSW spoke with pt and his mother and they feel comfortable with this, however pt's mother is not going to be back in town until Sunday afternoon, so pt will be a late d/c on Sunday.  Team made aware of this.  HH and DME arranged, as well as [facility] program.  CSW remains as needed.

## 2014-09-09 NOTE — Progress Notes (Signed)
Social Work Discharge Note  The overall goal for the admission was met for:   Discharge location: Yes - home  Length of Stay: Yes - 4 days  Discharge activity level: Yes - modified independent overall; supervision for stairs and car transfers  Home/community participation: Yes  Services provided included: MD, RD, PT, OT, RN, Pharmacy and SW  Financial Services: Other: self pay  Follow-up services arranged: Home Health: RN/SW from [place] and DME: 18"x18" w/c with basic cushion; 3-in-1; rolling walker; tub seat from [place] (or additional information):  Pt to return to his mother's home initially until he can return to [facility].  Pt is approved for [facility] program.  Patient/Family verbalized understanding of follow-up arrangements: Yes  Individual responsible for coordination of the follow-up plan: pt with assistance from his mother, as needed  Confirmed correct DME delivered: Trey Sailors 09/09/2014    Sharonna Vinje, Silvestre Mesi

## 2014-09-10 LAB — PROTIME-INR
INR: 2.27 — AB (ref 0.00–1.49)
Prothrombin Time: 25.2 seconds — ABNORMAL HIGH (ref 11.6–15.2)
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
## 2014-09-10 NOTE — Progress Notes (Signed)
09/10/14 1826 nsg Patient discharged to home per wheelchair accompanied by NT, mother and other members. Mother was asking why patient was being discharged after three days of rehab stay instead of two weeks. RN read all of the social workers notes. Mother had no other questions after that. Per patient all of his paper works are given to him.

## 2014-09-12 ENCOUNTER — Telehealth: Payer: Self-pay | Admitting: *Deleted

## 2014-09-12 NOTE — Telephone Encounter (Signed)
I notified his mother Agustin CreeDarlene to hold coumadin until further notice

## 2014-09-12 NOTE — Telephone Encounter (Signed)
Mr Sharma Covertorman INR is 5.5 by finger stick. (discharge note says goal between 2.0-3.0  She reports he took 7.5mg  coumadin.  I asked her to do a blood draw and run stat to verify INR.and call to us

## 2014-09-12 NOTE — Telephone Encounter (Signed)
Make sure patient holds coumadin until further notice.

## 2014-09-13 ENCOUNTER — Telehealth: Payer: Self-pay | Admitting: *Deleted

## 2014-09-13 NOTE — Telephone Encounter (Signed)
Buyer, retailBeth (English as a second language teachernurse [facility]) said she talked with Sybil yesterday regarding pt, his Protime = 5.5 and the venipuncture yielded an INR = 3.06, nurse is asking for advisement on how to proceed with pt's coumadin medication and asking how we want to him to take it, nurse says today is her day off, she will be in and out all day and suggested if you cannot get a hold of her to go ahead and call patient direct

## 2014-09-13 NOTE — Telephone Encounter (Signed)
I told him to hold it yesterday but [facility] took it in the morning so he knows not to take anymore until we call and to take in the evening only.  HHRN requesting to know when next draw should be done

## 2014-09-13 NOTE — Telephone Encounter (Signed)
Draw INR Thursday----hold coumadin until told otherwise-----i will likely be stopping soon.

## 2014-09-14 ENCOUNTER — Telehealth: Payer: Self-pay | Admitting: *Deleted

## 2014-09-14 NOTE — Telephone Encounter (Signed)
Called Beth back and relayed most recent message, Gillis EndsSybil texted back after calling Dr. Riley KillSwartz, confirmed that patient is to stop coumadin and on Monday will start taking coated aspirin twice a day.

## 2014-09-14 NOTE — Telephone Encounter (Signed)
Beth (home health nurse) called saying pt  held off his coumadin today, asking how to proceed from this point, venipuncture INR registerd at 3.06, nurse asked what to do next, we texted sybil and she confirmed that Dr. Rosalyn ChartersSwartz's verbal orders were to stop Coumadin until further notice, pt will get another protime reading on Jan. 25th

## 2014-09-14 NOTE — Telephone Encounter (Signed)
[name] told her to redraw the INR on Monday and advise of level.

## 2014-09-18 NOTE — Telephone Encounter (Signed)
Cora from [facility] called to report patient PT INR results.  Finger Stick = INR - 1.1 and PT - 13.7.  Will speak with Dr. Riley KillSwartz and see what he wants to do because patient was taken off blood thinners and put on 81 mg Aspirin daily.

## 2014-09-19 NOTE — Telephone Encounter (Signed)
Tried calling patient - home number busy, busy, busy.  Cell # mail box is full, could not leave message

## 2014-10-04 DIAGNOSIS — S2249XD Multiple fractures of ribs, unspecified side, subsequent encounter for fracture with routine healing: Secondary | ICD-10-CM

## 2014-10-04 DIAGNOSIS — F10229 Alcohol dependence with intoxication, unspecified: Secondary | ICD-10-CM

## 2014-10-04 DIAGNOSIS — Z4801 Encounter for change or removal of surgical wound dressing: Secondary | ICD-10-CM

## 2014-10-04 DIAGNOSIS — S32401D Unspecified fracture of right acetabulum, subsequent encounter for fracture with routine healing: Secondary | ICD-10-CM

## 2014-10-04 DIAGNOSIS — S32019D Unspecified fracture of first lumbar vertebra, subsequent encounter for fracture with routine healing: Secondary | ICD-10-CM

## 2014-10-04 DIAGNOSIS — Z5181 Encounter for therapeutic drug level monitoring: Secondary | ICD-10-CM

## 2014-10-06 ENCOUNTER — Ambulatory Visit: Payer: No Typology Code available for payment source | Attending: Internal Medicine | Admitting: Internal Medicine

## 2014-10-06 ENCOUNTER — Encounter: Payer: Self-pay | Admitting: Internal Medicine

## 2014-10-06 VITALS — BP 121/85 | HR 77 | Temp 98.5°F | Resp 16 | Wt 200.4 lb

## 2014-10-06 DIAGNOSIS — R52 Pain, unspecified: Secondary | ICD-10-CM | POA: Diagnosis not present

## 2014-10-06 DIAGNOSIS — Z791 Long term (current) use of non-steroidal anti-inflammatories (NSAID): Secondary | ICD-10-CM | POA: Diagnosis not present

## 2014-10-06 DIAGNOSIS — Z9889 Other specified postprocedural states: Secondary | ICD-10-CM

## 2014-10-06 DIAGNOSIS — S32401D Unspecified fracture of right acetabulum, subsequent encounter for fracture with routine healing: Secondary | ICD-10-CM | POA: Insufficient documentation

## 2014-10-06 DIAGNOSIS — Z87891 Personal history of nicotine dependence: Secondary | ICD-10-CM | POA: Diagnosis not present

## 2014-10-06 DIAGNOSIS — Z139 Encounter for screening, unspecified: Secondary | ICD-10-CM

## 2014-10-06 LAB — CBC WITH DIFFERENTIAL/PLATELET
Basophils Absolute: 0 10*3/uL (ref 0.0–0.1)
Basophils Relative: 0 % (ref 0–1)
EOS ABS: 0.4 10*3/uL (ref 0.0–0.7)
Eosinophils Relative: 5 % (ref 0–5)
HCT: 37.8 % — ABNORMAL LOW (ref 39.0–52.0)
Hemoglobin: 12.4 g/dL — ABNORMAL LOW (ref 13.0–17.0)
Lymphocytes Relative: 27 % (ref 12–46)
Lymphs Abs: 2.4 10*3/uL (ref 0.7–4.0)
MCH: 29.4 pg (ref 26.0–34.0)
MCHC: 32.8 g/dL (ref 30.0–36.0)
MCV: 89.6 fL (ref 78.0–100.0)
MONO ABS: 0.8 10*3/uL (ref 0.1–1.0)
MPV: 10.3 fL (ref 8.6–12.4)
Monocytes Relative: 9 % (ref 3–12)
NEUTROS ABS: 5.3 10*3/uL (ref 1.7–7.7)
NEUTROS PCT: 59 % (ref 43–77)
PLATELETS: 405 10*3/uL — AB (ref 150–400)
RBC: 4.22 MIL/uL (ref 4.22–5.81)
RDW: 14.3 % (ref 11.5–15.5)
WBC: 8.9 10*3/uL (ref 4.0–10.5)

## 2014-10-06 LAB — COMPLETE METABOLIC PANEL WITH GFR
ALT: 13 U/L (ref 0–53)
AST: 12 U/L (ref 0–37)
Albumin: 3.7 g/dL (ref 3.5–5.2)
Alkaline Phosphatase: 107 U/L (ref 39–117)
BUN: 11 mg/dL (ref 6–23)
CALCIUM: 9.1 mg/dL (ref 8.4–10.5)
CHLORIDE: 101 meq/L (ref 96–112)
CO2: 28 meq/L (ref 19–32)
Creat: 0.77 mg/dL (ref 0.50–1.35)
GFR, Est Non African American: >89 mL/min
GLUCOSE: 95 mg/dL (ref 70–99)
Potassium: 4.1 mEq/L (ref 3.5–5.3)
Sodium: 137 mEq/L (ref 135–145)
Total Bilirubin: 0.2 mg/dL (ref 0.2–1.2)
Total Protein: 7.5 g/dL (ref 6.0–8.3)

## 2014-10-06 LAB — TSH: TSH: 0.666 u[IU]/mL (ref 0.350–4.500)

## 2014-10-06 LAB — POCT INR: INR: 2.8
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
## 2014-10-06 NOTE — Progress Notes (Deleted)
Patient Demographics  [name], is a 39 y.o. male  ZOX:[id number]  [id number]  DOB - [birth year]  CC:  Chief Complaint  Patient presents with  . Establish Care       HPI: [name] is a 39 y.o. male here today to establish medical care. Patient has No headache, No chest pain, No abdominal pain - No Nausea, No new weakness tingling or numbness, No Cough - SOB.  No Known Allergies History reviewed. No pertinent past medical history. Current Outpatient Prescriptions on File Prior to Visit  Medication Sig Dispense Refill  . methocarbamol (ROBAXIN) 500 MG tablet Take 1 tablet (500 mg total) by mouth every 6 (six) hours as needed for muscle spasms. 90 tablet 0  . oxyCODONE 10 MG TABS Take 1-1.5 tablets (10-15 mg total) by mouth every 4 (four) hours as needed (  for mild pain,  for moderate pain,  for severe pain). 90 tablet 0  . traMADol (ULTRAM) 50 MG tablet Take 2 tablets (100 mg total) by mouth every 6 (six) hours. 60 tablet 0  . warfarin (COUMADIN) 5 MG tablet 1-1/2 tablets daily until 10/05/2014 and stop 60 tablet 1   No current facility-administered medications on file prior to visit.   Family History  Problem Relation Age of Onset  . Cancer Sister   . Heart disease Maternal Grandfather   . Cancer Maternal Aunt     sarcoma    History   Social History  . Marital Status: Single    Spouse Name: N/A  . Number of Children: N/A  . Years of Education: N/A   Occupational History  . Not on file.   Social History Main Topics  . Smoking status: Former Smoker -- 0.50 packs/day for 4 years  . Smokeless tobacco: Current User     Comment: uses  e cigarettes  . Alcohol Use: Yes  . Drug Use: No  . Sexual Activity: Not on file   Other Topics Concern  . Not on file   Social History Narrative    Review of Systems: Constitutional: Negative for fever, chills, diaphoresis, activity change, appetite change and fatigue. HENT: Negative for ear  pain, nosebleeds, congestion, facial swelling, rhinorrhea, neck pain, neck stiffness and ear discharge.  Eyes: Negative for pain, discharge, redness, itching and visual disturbance. Respiratory: Negative for cough, choking, chest tightness, shortness of breath, wheezing and stridor.  Cardiovascular: Negative for chest pain, palpitations and leg swelling. Gastrointestinal: Negative for abdominal distention. Genitourinary: Negative for dysuria, urgency, frequency, hematuria, flank pain, decreased urine volume, difficulty urinating and dyspareunia.  Musculoskeletal: Negative for back pain, joint swelling, arthralgia and gait problem. Neurological: Negative for dizziness, tremors, seizures, syncope, facial asymmetry, speech difficulty, weakness, light-headedness, numbness and headaches.  Hematological: Negative for adenopathy. Does not bruise/bleed easily. Psychiatric/Behavioral: Negative for hallucinations, behavioral problems, confusion, dysphoric mood, decreased concentration and agitation.    Objective:   Filed Vitals:   10/06/14 1534  BP: 121/85  Pulse: 77  Temp: 98.5 F (36.9 C)  Resp: 16    Physical Exam: Constitutional: Patient appears well-developed and well-nourished. No distress. HENT: Normocephalic, atraumatic, External right and left ear normal. Oropharynx is clear and moist.  Eyes: Conjunctivae and EOM are normal. PERRLA, no scleral icterus. Neck: Normal ROM. Neck supple. No JVD. No tracheal deviation. No thyromegaly. CVS: RRR, S1/S2 +, no murmurs, no gallops, no carotid bruit.  Pulmonary: Effort and breath sounds normal, no stridor, rhonchi, wheezes, rales.  Abdominal: Soft. BS +, no distension, tenderness, rebound  or guarding.  Musculoskeletal: Normal range of motion. No edema and no tenderness.  Lymphadenopathy: No lymphadenopathy noted, cervical, inguinal or axillary Neuro: Alert. Normal reflexes, muscle tone coordination. No cranial nerve deficit. Skin: Skin is warm  and dry. No rash noted. Not diaphoretic. No erythema. No pallor. Psychiatric: Normal mood and affect. Behavior, judgment, thought content normal.  Lab Results  Component Value Date   WBC 9.8 09/07/2014   HGB 12.2* 09/07/2014   HCT 35.0* 09/07/2014   MCV 89.3 09/07/2014   PLT 275 09/07/2014   Lab Results  Component Value Date   CREATININE 0.80 09/07/2014   BUN 9 09/07/2014   NA 135 09/07/2014   K 4.1 09/07/2014   CL 91* 09/07/2014   CO2 30 09/07/2014    No results found for: HGBA1C Lipid Panel  No results found for: CHOL, TRIG, HDL, CHOLHDL, VLDL, LDLCALC     Assessment and plan:   1. Status post hip surgery *** - INR  2. Screening ***  3. Pain ***        Health Maintenance -Colonoscopy: -Pap Smear: -Mammogram: -Vaccinations:  -TdAP  -PNA (PPSV23) (one dose after 65) (or one dose before 65 if chronic conditions)  -Zoster (1 dose after 60 yrs)  -Influenza  No Follow-up on file.   Doris CheadleADVANI, Makye Radle, MD
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
## 2014-10-07 LAB — VITAMIN D 25 HYDROXY (VIT D DEFICIENCY, FRACTURES): Vit D, 25-Hydroxy: 15 ng/mL — ABNORMAL LOW (ref 30–100)

## 2014-10-07 LAB — HEMOGLOBIN A1C
HEMOGLOBIN A1C: 6.2 % — AB (ref <5.7)
Mean Plasma Glucose: 131 mg/dL — ABNORMAL HIGH (ref <117)

## 2014-10-12 ENCOUNTER — Ambulatory Visit: Payer: Self-pay | Attending: Internal Medicine

## 2014-10-12 DIAGNOSIS — Z299 Encounter for prophylactic measures, unspecified: Secondary | ICD-10-CM

## 2014-10-12 LAB — POCT INR: INR: 6.2

## 2014-10-12 NOTE — Patient Instructions (Signed)
Vitamin K Foods and Warfarin Warfarin is a medicine that helps prevent harmful blood clots by causing blood to clot more slowly. It does this by decreasing the activity of vitamin K, which promotes normal blood clotting. For the dose of warfarin you have been prescribed to work well, you need to get about the same amount of vitamin K from your food from day to day. Suddenly getting a lot more vitamin K could cause your blood to clot too quickly. A sudden decrease in vitamin K intake could cause your blood to clot too slowly. These changes in vitamin K intake could lead to dangerous blood clotsor to bleeding. WHAT GENERAL GUIDELINES DO I NEED TO FOLLOW?  Keep your intake of vitamin K consistent from day to day. To do this, you must be aware of which foods contain moderate or high amounts of vitamin K. Listed below are some foods that are very high, high, or moderately high in vitamin K. If you eat these foods, make sure you eat a consistent amount of them from day to day.  Avoid major changes in your diet, or tell your health care provider before changing your diet.  If you take a multivitamin that contains vitamin K, be sure to take it every day.  If you drink green tea, drink the same amount each day. WHAT FOODS ARE VERY HIGH IN VITAMIN K?   Greens, such as Swiss chard and beet, collard, mustard, or turnip greens (fresh or frozen, cooked).  Kale (fresh or frozen, cooked).   Parsley (raw).  Spinach (cooked).  WHAT FOODS ARE HIGH IN VITAMIN K?  Asparagus (frozen, cooked).  Beans, green (frozen, cooked).  Broccoli.   Bok choy (cooked).   Brussels sprouts (fresh or frozen, cooked).  Cabbage (cooked).  Coleslaw. WHAT FOODS ARE MODERATELY HIGH IN VITAMIN K?  Blueberries.  Black-eyed peas.  Endive (raw).   Green leaf lettuce (raw).   Green scallions (raw).  Kale (raw).  Okra (frozen, cooked).  Plantains (fried).  Romaine lettuce (raw).   Sauerkraut  (canned).   Spinach (raw). Document Released: 06/08/2009 Document Revised: 08/16/2013 Document Reviewed: 06/15/2013 Exit[employer] Patient Information 2015 [employer]. This information is not intended to replace advice given to you by your health care provider. Make sure you discuss any questions you have with your health care provider.  

## 2014-10-19 ENCOUNTER — Ambulatory Visit: Payer: Self-pay | Attending: Internal Medicine

## 2014-10-19 ENCOUNTER — Telehealth: Payer: Self-pay

## 2014-10-19 DIAGNOSIS — S32401A Unspecified fracture of right acetabulum, initial encounter for closed fracture: Secondary | ICD-10-CM

## 2014-10-19 LAB — POCT INR: INR: 2.7

## 2014-10-19 MED ORDER — VITAMIN D (ERGOCALCIFEROL) 1.25 MG (50000 UNIT) PO CAPS: 50000.0000 [IU] | ORAL_CAPSULE | ORAL | Status: DC

## 2014-10-19 NOTE — Telephone Encounter (Signed)
Patient is aware of his lab results Prescription sent to the [facility] on file

## 2014-10-19 NOTE — Telephone Encounter (Signed)
-----   Message from Doris Cheadleeepak Advani, MD sent at 10/09/2014 12:26 PM EST ----- Blood work reviewed, noticed low vitamin D, call patient advise to start ergocalciferol 50,000 units once a week for the duration of  12 weeks.  Blood work reviewed noticed hemoglobin A1c of 6.2%, patient has prediabetes, call and advise patient for low carbohydrate diet.

## 2014-10-23 ENCOUNTER — Ambulatory Visit: Payer: Self-pay

## 2014-10-30 ENCOUNTER — Ambulatory Visit: Payer: Self-pay | Attending: Internal Medicine

## 2014-10-30 DIAGNOSIS — S32409A Unspecified fracture of unspecified acetabulum, initial encounter for closed fracture: Secondary | ICD-10-CM

## 2014-10-30 DIAGNOSIS — S32401A Unspecified fracture of right acetabulum, initial encounter for closed fracture: Secondary | ICD-10-CM

## 2014-10-30 LAB — POCT INR: INR: 1.3

## 2014-10-30 NOTE — Patient Instructions (Signed)
Warfarin: What You Need to Know Warfarin is an anticoagulant. Anticoagulants help prevent the formation of blood clots. They also help stop the growth of blood clots. Warfarin is sometimes referred to as a "blood thinner."  Normally, when body tissues are cut or damaged, the blood clots in order to prevent blood loss. Sometimes clots form inside your blood vessels and obstruct the flow of blood through your circulatory system (thrombosis). These clots may travel through your bloodstream and become lodged in smaller blood vessels in your brain, which can cause a stroke, or in your lungs (pulmonary embolism). WHO SHOULD USE WARFARIN? Warfarin is prescribed for people at risk of developing harmful blood clots:  People with surgically implanted mechanical heart valves, irregular heart rhythms called atrial fibrillation, and certain clotting disorders.  People who have developed harmful blood clotting in the past, including those who have had a stroke or a pulmonary embolism, or thrombosis in their legs (deep vein thrombosis [DVT]).  People with an existing blood clot, such as a pulmonary embolism. WARFARIN DOSING Warfarin tablets come in different strengths. Each tablet strength is a different color, with the amount of warfarin (in milligrams) clearly printed on the tablet. If the color of your tablet is different than usual when you receive a new prescription, report it immediately to your pharmacist or health care provider. WARFARIN MONITORING The goal of warfarin therapy is to lessen the clotting tendency of blood but not prevent clotting completely. Your health care provider will monitor the anticoagulation effect of warfarin closely and adjust your dose as needed. For your safety, blood tests called prothrombin time (PT) or international normalized ratio (INR) are used to measure the effects of warfarin. Both of these tests can be done with a finger stick or a blood draw. The longer it takes the  blood to clot, the higher the PT or INR. Your health care provider will inform you of your "target" PT or INR range. If, at any time, your PT or INR is above the target range, there is a risk of bleeding. If your PT or INR is below the target range, there is a risk of clotting. Whether you are started on warfarin while you are in the hospital or in your health care provider's office, you will need to have your PT or INR checked within one week of starting the medicine. Initially, some people are asked to have their PT or INR checked as much as twice a week. Once you are on a stable maintenance dose, the PT or INR is checked less often, usually once every 2 to 4 weeks. The warfarin dose may be adjusted if the PT or INR is not within the target range. It is important to keep all laboratory and health care provider follow-up appointments. Not keeping appointments could result in a chronic or permanent injury, pain, or disability because warfarin is a medicine that requires close monitoring. WHAT ARE THE SIDE EFFECTS OF WARFARIN?  Too much warfarin can cause bleeding (hemorrhage) from any part of the body. This may include bleeding from the gums, blood in the urine, bloody or dark stools, a nosebleed that is not easily stopped, coughing up blood, or vomiting blood.  Too little warfarin can increase the risk of blood clots.  Too little or too much warfarin can also increase the risk of a stroke.  Warfarin use may cause a skin rash or irritation, an unusual fever, continual nausea or stomach upset, or severe pain in your joints or back.   SPECIAL PRECAUTIONS WHILE TAKING WARFARIN Warfarin should be taken exactly as directed. It is very important to take warfarin as directed since bleeding or blood clots could result in chronic or permanent injury, pain, or disability.  Take your medicine at the same time every day. If you forget to take your dose, you can take it if it is within 6 hours of when it was  due.  Do not change the dose of warfarin on your own to make up for missed or extra doses.  If you miss more than 2 doses in a row, you should contact your health care provider for advice. Avoid situations that cause bleeding. You may have a tendency to bleed more easily than usual while taking warfarin. The following actions can limit bleeding:  Using a softer toothbrush.  Flossing with waxed floss rather than unwaxed floss.  Shaving with an electric razor rather than a blade.  Limiting the use of sharp objects.  Avoiding potentially harmful activities, such as contact sports. Warfarin and Pregnancy or Breastfeeding  Warfarin is not advised during the first trimester of pregnancy due to an increased risk of birth defects. In certain situations, a woman may take warfarin after her first trimester of pregnancy. A woman who becomes pregnant or plans to become pregnant while taking warfarin should notify her health care provider immediately.  Although warfarin does not pass into breast milk, a woman who wishes to breastfeed while taking warfarin should also consult with her health care provider. Alcohol, Smoking, and Illicit Drug Use  Alcohol affects how warfarin works in the body. It is best to avoid alcoholic drinks or consume very small amounts while taking warfarin. In general, alcohol intake should be limited to 1 oz (30 mL) of liquor, 6 oz (180 mL) of wine, or 12 oz (360 mL) of beer each day. Notify your health care provider if you change your alcohol intake.  Smoking affects how warfarin works. It is best to avoid smoking while taking warfarin. Notify your health care provider if you change your smoking habits.  It is best to avoid all illicit drugs while taking warfarin since there are few studies that show how warfarin interacts with these drugs. Other Medicines and Dietary Supplements Many prescription and over-the-counter medicines can interfere with warfarin. Be sure all of your  health care providers know you are taking warfarin. Notify your health care provider who prescribed warfarin for you or your pharmacist before starting or stopping any new medicines, including over-the-counter vitamins, dietary supplements, and pain medicines. Your warfarin dose may need to be adjusted. Some common over-the-counter medicines that may increase the risk of bleeding while taking warfarin include:   Acetaminophen.  Aspirin.  Nonsteroidal anti-inflammatory medicines (NSAIDs), such as ibuprofen or naproxen.  Vitamin E. Dietary Considerations  Foods that have moderate or high amounts of vitamin K can interfere with warfarin. Avoid major changes in your diet or notify your health care provider before changing your diet. Eat a consistent amount of foods that have moderate or high amounts of vitamin K. Eating less foods containing vitamin K can increase the risk of bleeding. Eating more foods containing vitamin K can increase the risk of blood clots. Additional questions about dietary considerations can be discussed with a dietitian. Foods that are very high in vitamin K:  Greens, such as Swiss chard and beet, collard, mustard, or turnip greens (fresh or frozen, cooked).  Kale (fresh or frozen, cooked).  Parsley (raw).  Spinach (cooked). Foods that are high   in vitamin K:  Asparagus (frozen, cooked).  Beans, green (frozen, cooked).  Broccoli.  Bok choy (cooked).  Brussels sprouts (fresh or frozen, cooked).  Cabbage (cooked).   Coleslaw. Foods that are moderately high in vitamin K:  Blueberries.  Black-eyed peas.  Endive (raw).  Green leaf lettuce (raw).  Green scallions (raw).  Kale (raw).  Okra (frozen, cooked).  Plantains (fried).  Romaine lettuce (raw).  Sauerkraut (canned).  Spinach (raw). CALL YOUR CLINIC OR HEALTH CARE PROVIDER IF YOU:  Plan to have any surgery or procedure.  Feel sick, especially if you have diarrhea or  vomiting.  Experience or anticipate any major changes in your diet.  Start or stop a prescription or over-the-counter medicine.  Become, plan to become, or think you may be pregnant.  Are having heavier than usual menstrual periods.  Have had a fall, accident, or any symptoms of bleeding or unusual bruising.  Develop an unusual fever. CALL 911 IN THE [place] OR GO TO THE EMERGENCY DEPARTMENT IF YOU:   Think you may be having an allergic reaction to warfarin. The signs of an allergic reaction could include itching, rash, hives, swelling, chest tightness, or trouble breathing.  See signs of blood in your urine. The signs could include reddish, pinkish, or tea-colored urine.  See signs of blood in your stools. The signs could include bright red or black stools.  Vomit or cough up blood. In these instances, the blood could have either a bright red or a "coffee-grounds" appearance.  Have bleeding that will not stop after applying pressure for 30 minutes such as cuts, nosebleeds, or other injuries.  Have severe pain in your joints or back.  Have a new and severe headache.  Have sudden weakness or numbness of your face, arm, or leg, especially on one side of your body.  Have sudden confusion or trouble understanding.  Have sudden trouble seeing in one or both eyes.  Have sudden trouble walking, dizziness, loss of balance, or coordination.  Have trouble speaking or understanding (aphasia). Document Released: 08/11/2005 Document Revised: 12/26/2013 Document Reviewed: 02/04/2013 Exit[employer] Patient Information 2015 [employer]. This information is not intended to replace advice given to you by your health care provider. Make sure you discuss any questions you have with your health care provider.  

## 2014-11-01 ENCOUNTER — Ambulatory Visit: Payer: No Typology Code available for payment source | Attending: Internal Medicine

## 2014-11-07 ENCOUNTER — Ambulatory Visit: Payer: No Typology Code available for payment source | Attending: Internal Medicine

## 2014-11-07 DIAGNOSIS — S32401A Unspecified fracture of right acetabulum, initial encounter for closed fracture: Secondary | ICD-10-CM

## 2014-11-07 LAB — POCT INR: INR: 1.4

## 2014-11-14 ENCOUNTER — Ambulatory Visit: Payer: Self-pay | Attending: Internal Medicine

## 2014-11-14 ENCOUNTER — Ambulatory Visit: Payer: No Typology Code available for payment source | Attending: Orthopedic Surgery | Admitting: Physical Therapy

## 2014-11-14 ENCOUNTER — Encounter: Payer: Self-pay | Admitting: Physical Therapy

## 2014-11-14 DIAGNOSIS — M25651 Stiffness of right hip, not elsewhere classified: Secondary | ICD-10-CM | POA: Diagnosis not present

## 2014-11-14 DIAGNOSIS — M25551 Pain in right hip: Secondary | ICD-10-CM | POA: Diagnosis present

## 2014-11-14 DIAGNOSIS — S32409S Unspecified fracture of unspecified acetabulum, sequela: Secondary | ICD-10-CM

## 2014-11-14 DIAGNOSIS — R262 Difficulty in walking, not elsewhere classified: Secondary | ICD-10-CM | POA: Insufficient documentation

## 2014-11-14 DIAGNOSIS — S32401A Unspecified fracture of right acetabulum, initial encounter for closed fracture: Secondary | ICD-10-CM

## 2014-11-14 LAB — POCT INR: INR: 2.2
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
## 2014-11-14 NOTE — Patient Instructions (Signed)
Asked patient to continue with the light ex given to him by MD, avoid standing on JUST his Rt. LE and work on keeping his Rt. Leg in neutral (try to keep toes forward as he walks).

## 2014-11-27 ENCOUNTER — Ambulatory Visit: Payer: No Typology Code available for payment source | Attending: Orthopedic Surgery | Admitting: Physical Therapy

## 2014-11-27 DIAGNOSIS — R262 Difficulty in walking, not elsewhere classified: Secondary | ICD-10-CM | POA: Diagnosis not present

## 2014-11-27 DIAGNOSIS — M25651 Stiffness of right hip, not elsewhere classified: Secondary | ICD-10-CM | POA: Insufficient documentation

## 2014-11-27 DIAGNOSIS — M25551 Pain in right hip: Secondary | ICD-10-CM | POA: Diagnosis not present
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
## 2014-11-27 NOTE — Patient Instructions (Signed)
Adduction: Hip - Knees Together (Hook-Lying)   Lie with hips and knees bent, towel roll between knees. Push knees together. Hold for _5__ seconds. Rest for _5__ seconds. Repeat _10-20__ times. Do _2__ times a day.  HIP: Abduction / External Rotation - Side-Lying   Lie on side with hip and knees bent. Raise top knee up, squeezing glutes. Keep ankles together. ___ reps per set, ___ sets per day, ___ days per week   Copyright  VHI. All rights reserved.   Rubbie Battiest. Bridge   Lie back, legs bent. Inhale, pressing hips up. Keeping ribs in, lengthen lower back. Exhale, rolling down along spine from top. Repeat ___10-20_ times. Do _2___ sessions per day.  Copyright  VHI. All rights reserved.   Knee High   Holding stable object, raise knee to hip level, then lower knee. Repeat with other knee. Complete __10_ repetitions. Do __2__ sessions per day.  ABDUCTION: Standing (Active)   Stand, feet flat. Lift right leg out to side. Use _0__ lbs. Complete __10_ repetitions. Perform __2_ sessions per day.  ADDUCTION: Standing - Stable (Active)   Stand, right leg out to side as far as possible. Draw leg in across midline. Use _0__ lbs. Complete 10_ repetitions. Perform _2__ sessions per day.       EXTENSION: Standing (Active)  Stand, both feet flat. Draw right leg behind body as far as possible. Use 0___ lbs. Complete 10 repetitions. Perform __2_ sessions per day.  Copyright  VHI. All rights reserved.

## 2014-12-04 ENCOUNTER — Ambulatory Visit: Payer: No Typology Code available for payment source | Admitting: Physical Therapy

## 2014-12-04 DIAGNOSIS — M25551 Pain in right hip: Secondary | ICD-10-CM

## 2014-12-04 DIAGNOSIS — M25651 Stiffness of right hip, not elsewhere classified: Secondary | ICD-10-CM

## 2014-12-04 DIAGNOSIS — R262 Difficulty in walking, not elsewhere classified: Secondary | ICD-10-CM
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
## 2014-12-11 ENCOUNTER — Ambulatory Visit: Payer: No Typology Code available for payment source | Admitting: Physical Therapy

## 2014-12-11 DIAGNOSIS — M25551 Pain in right hip: Secondary | ICD-10-CM

## 2014-12-11 DIAGNOSIS — M25651 Stiffness of right hip, not elsewhere classified: Secondary | ICD-10-CM

## 2014-12-11 DIAGNOSIS — R262 Difficulty in walking, not elsewhere classified: Secondary | ICD-10-CM
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
## 2014-12-18 ENCOUNTER — Ambulatory Visit: Payer: No Typology Code available for payment source | Admitting: Physical Therapy

## 2014-12-18 DIAGNOSIS — M25651 Stiffness of right hip, not elsewhere classified: Secondary | ICD-10-CM

## 2014-12-18 DIAGNOSIS — R262 Difficulty in walking, not elsewhere classified: Secondary | ICD-10-CM

## 2014-12-18 DIAGNOSIS — M25551 Pain in right hip: Secondary | ICD-10-CM
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
## 2014-12-25 ENCOUNTER — Ambulatory Visit: Payer: No Typology Code available for payment source | Attending: Orthopedic Surgery | Admitting: Physical Therapy

## 2014-12-25 DIAGNOSIS — R262 Difficulty in walking, not elsewhere classified: Secondary | ICD-10-CM | POA: Insufficient documentation

## 2014-12-25 DIAGNOSIS — M25651 Stiffness of right hip, not elsewhere classified: Secondary | ICD-10-CM | POA: Insufficient documentation

## 2014-12-25 DIAGNOSIS — M25551 Pain in right hip: Secondary | ICD-10-CM | POA: Insufficient documentation

## 2015-01-01 ENCOUNTER — Ambulatory Visit: Payer: No Typology Code available for payment source | Admitting: Physical Therapy

## 2015-01-01 DIAGNOSIS — M25551 Pain in right hip: Secondary | ICD-10-CM

## 2015-01-01 DIAGNOSIS — M25651 Stiffness of right hip, not elsewhere classified: Secondary | ICD-10-CM | POA: Diagnosis not present

## 2015-01-01 DIAGNOSIS — R262 Difficulty in walking, not elsewhere classified: Secondary | ICD-10-CM | POA: Diagnosis not present
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
## 2015-01-08 ENCOUNTER — Ambulatory Visit: Payer: No Typology Code available for payment source | Admitting: Physical Therapy

## 2015-01-08 DIAGNOSIS — M25651 Stiffness of right hip, not elsewhere classified: Secondary | ICD-10-CM

## 2015-01-08 DIAGNOSIS — M25551 Pain in right hip: Secondary | ICD-10-CM | POA: Diagnosis not present

## 2015-01-08 DIAGNOSIS — R262 Difficulty in walking, not elsewhere classified: Secondary | ICD-10-CM
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
## 2015-01-15 ENCOUNTER — Encounter: Payer: Self-pay | Admitting: Physical Therapy
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
## 2017-01-10 IMAGING — CR DG PELVIS 3+V JUDET
3 series · 3 of 3 positions shown · non-contrast
Comparison: Intraoperative films performed earlier today at [DATE]
p.m.

CLINICAL DATA: Status post internal fixation of right acetabular
fracture. Initial encounter.

EXAM:
JUDET PELVIS - 3+ VIEW

[AP (1 of 3)]
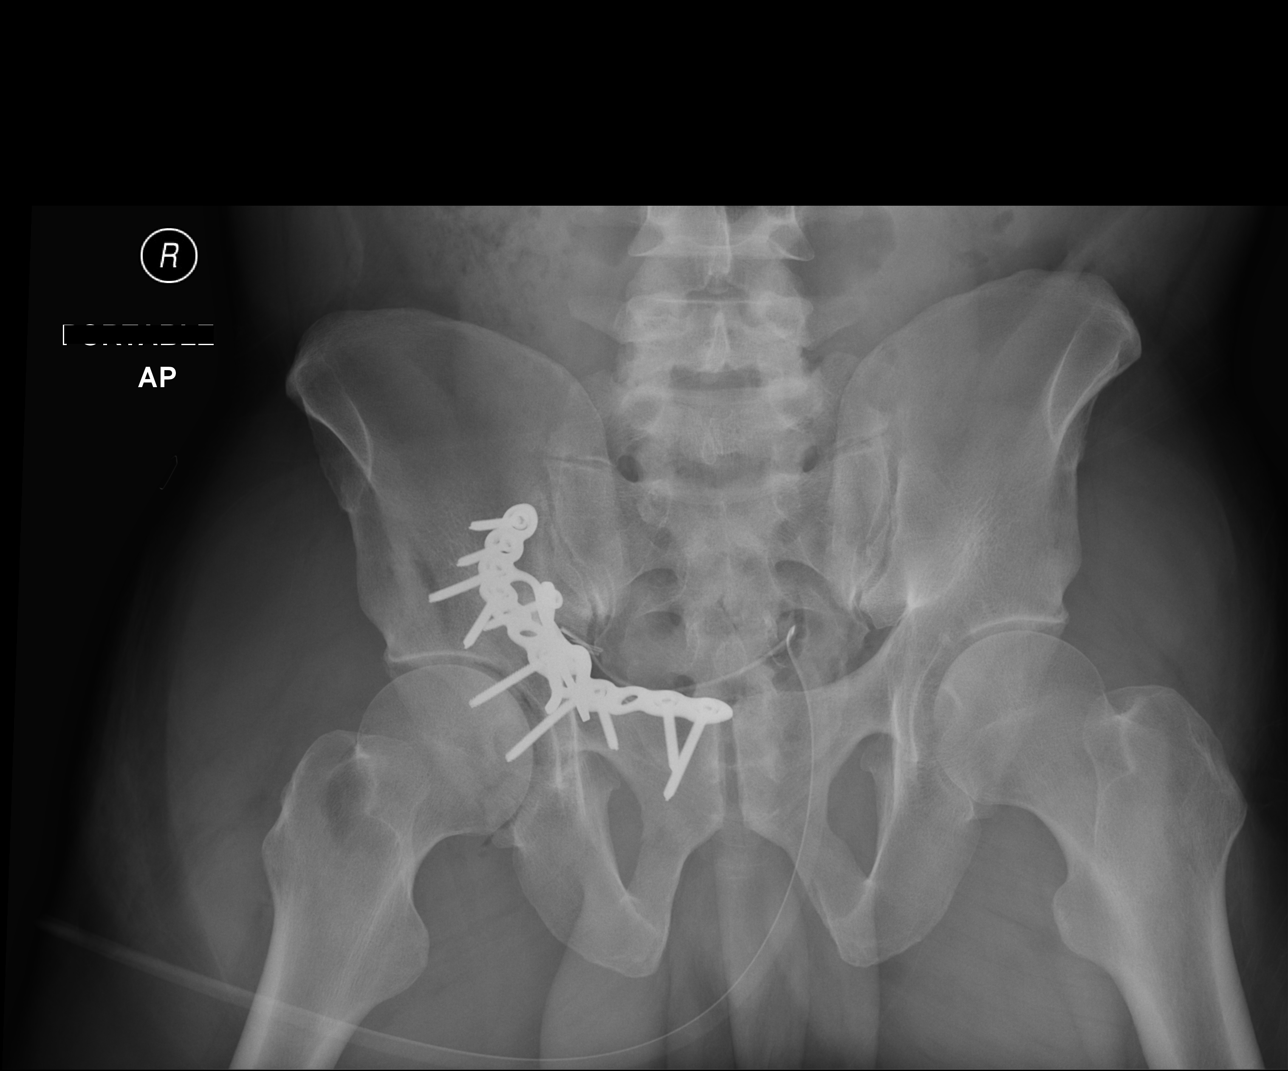

[AP (2 of 3)]
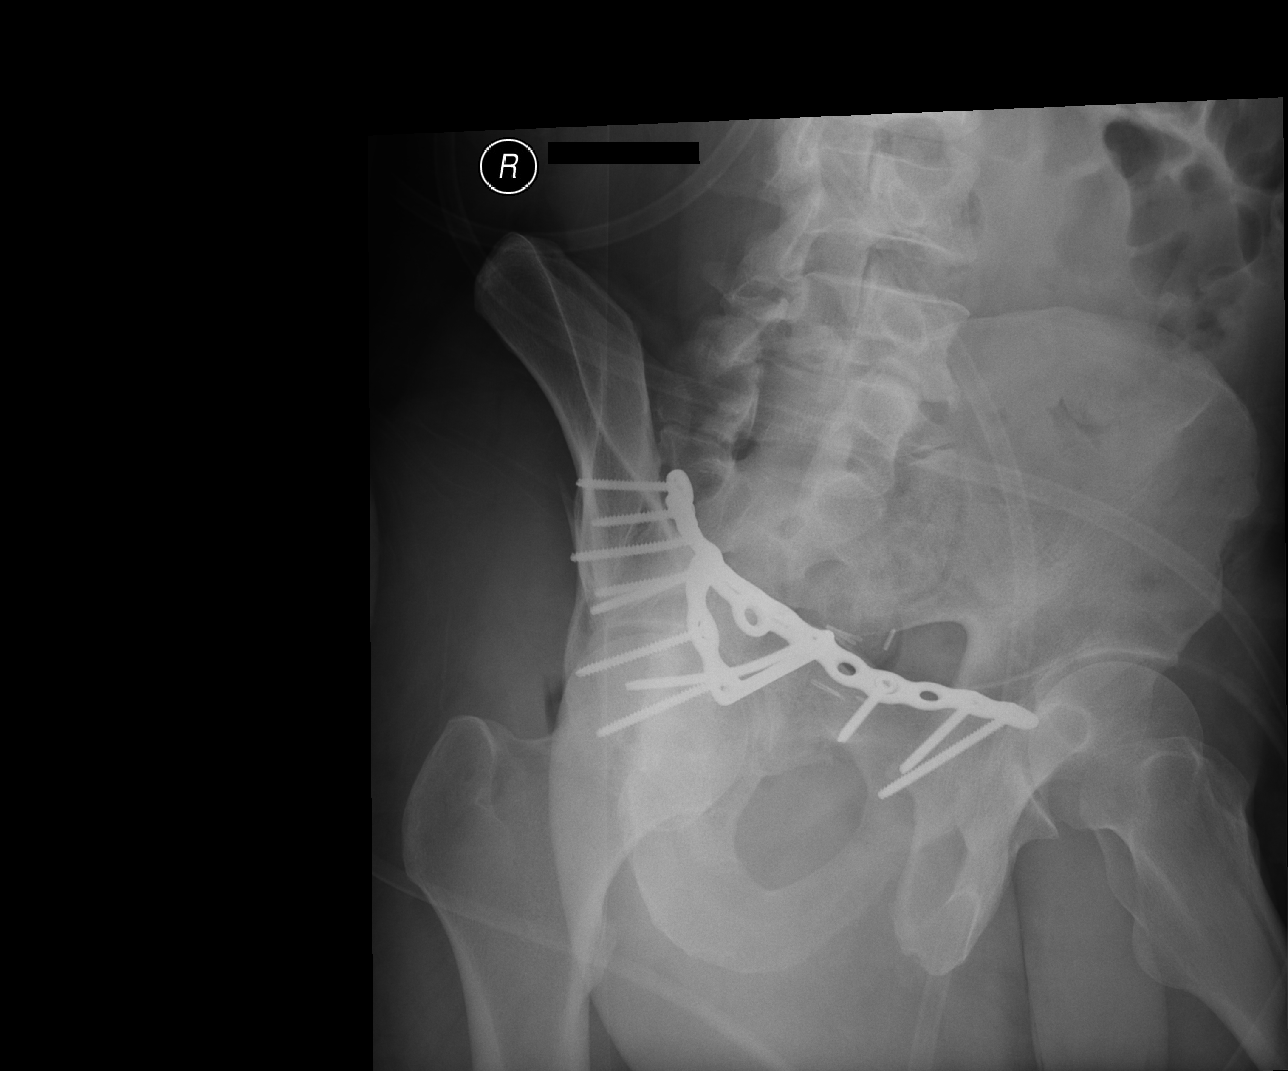

[AP (3 of 3)]
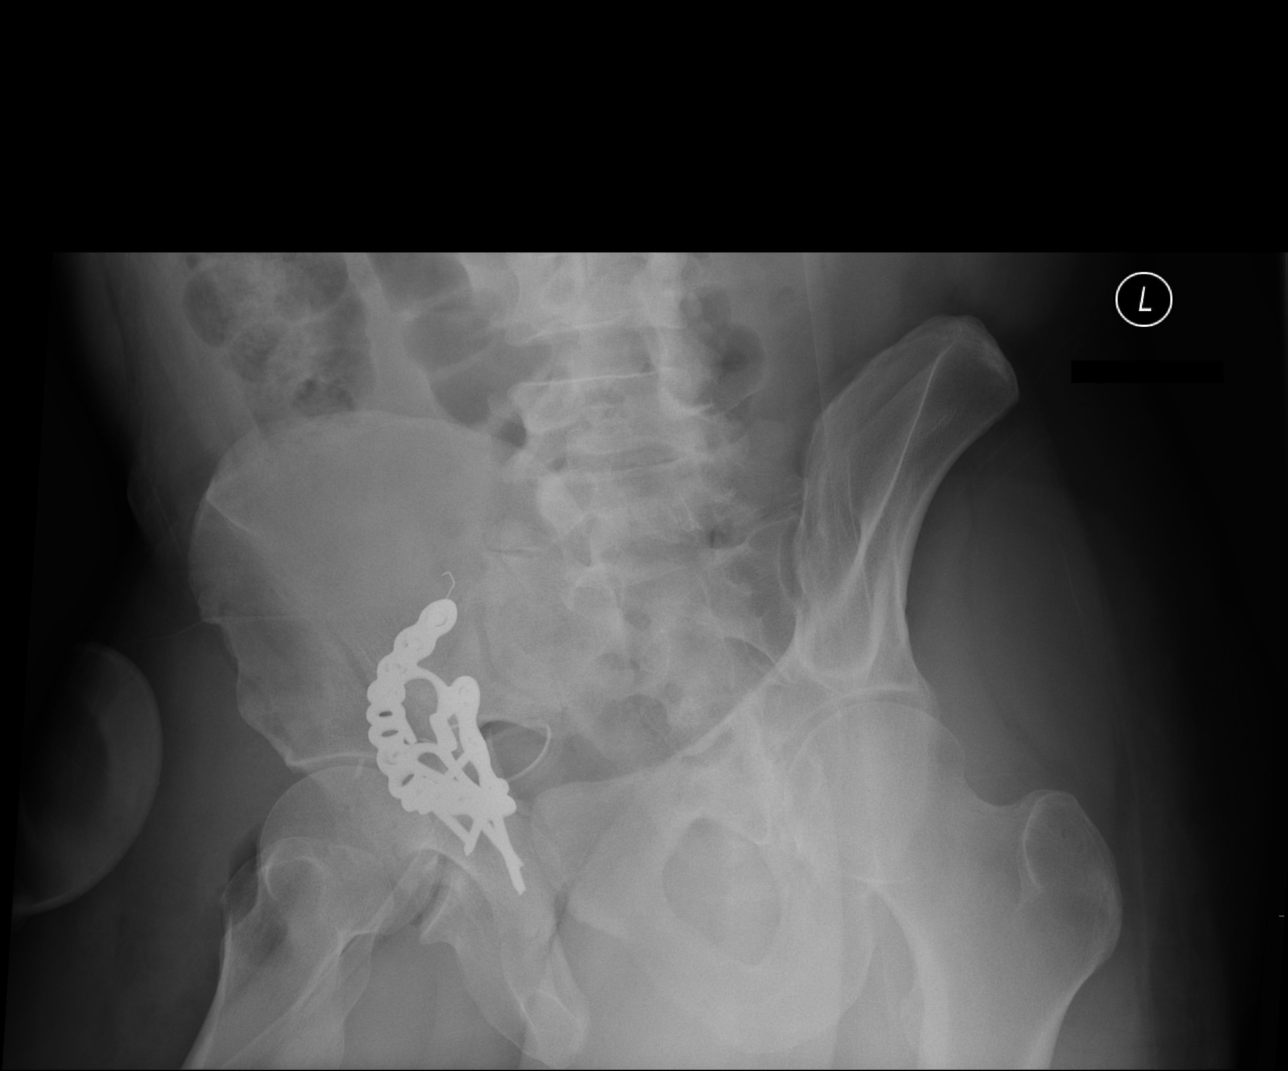

[3 of 3 positions shown; findings below may reference images not displayed]

FINDINGS: The patient is status post internal fixation of the right ischial
fracture, seen in near-anatomic alignment. Minimal residual
displacement is noted at the non-weightbearing surface of the
acetabulum. The right femoral head remains seated at the acetabulum.

The sacroiliac joints are grossly unremarkable. The left hip joint
is normal in appearance. A drainage catheter is seen overlying the
right hemipelvis. Scattered air is noted at the right hip joint
space.
IMPRESSION: Status post internal fixation of right ischial fracture, noted in
near-anatomic alignment.

## 2018-09-11 ENCOUNTER — Encounter (HOSPITAL_COMMUNITY): Payer: Self-pay | Admitting: Emergency Medicine

## 2018-09-11 ENCOUNTER — Ambulatory Visit (HOSPITAL_COMMUNITY)
Admission: EM | Admit: 2018-09-11 | Discharge: 2018-09-11 | Disposition: A | Discharge status: Home / Self Care | Payer: Self-pay | Attending: Family Medicine | Admitting: Family Medicine

## 2018-09-11 DIAGNOSIS — J069 Acute upper respiratory infection, unspecified: Secondary | ICD-10-CM | POA: Insufficient documentation

## 2018-09-11 DIAGNOSIS — R030 Elevated blood-pressure reading, without diagnosis of hypertension: Secondary | ICD-10-CM | POA: Insufficient documentation

## 2018-09-11 DIAGNOSIS — B9789 Other viral agents as the cause of diseases classified elsewhere: Secondary | ICD-10-CM | POA: Insufficient documentation

## 2018-09-11 MED ORDER — BENZONATATE 100 MG PO CAPS: ORAL_CAPSULE | ORAL | 0 refills | Status: DC

## 2018-09-11 NOTE — ED Provider Notes (Signed)
[facility]   [id number] 09/11/18 Arrival Time: 1208  ASSESSMENT & PLAN:  1. Viral URI with cough   2. Elevated blood pressure reading in office without diagnosis of hypertension    See AVS for discharge nstructions.  Meds ordered this encounter  Medications  . benzonatate (TESSALON) 100 MG capsule    Sig: Take 1 capsule by mouth every 8 (eight) hours for cough.    Dispense:  21 capsule    Refill:  0   Clinical Course  Sat Sep 11, 2018  1310 Noted. Discussed. Plans to est care with PCP.  BP(!): 156/101 [BH]     Discussed typical duration of symptoms. OTC symptom care as needed. Ensure adequate fluid intake and rest. May f/u with PCP or here as needed.  Reviewed expectations re: course of current medical issues. Questions answered. Outlined signs and symptoms indicating need for more acute intervention. Patient verbalized understanding. After Visit Summary given.   SUBJECTIVE: History from: patient.  [name] is a 43 y.o. male who presents with complaint of nasal congestion, post-nasal drainage, and a persistent non-productive dry cough; without sore throat. Onset abrupt, about 3 days ago; with mild fatigue and without body aches. SOB: none. Wheezing: none reported. Fever: no. Overall normal PO intake without n/v. Known sick contacts: no. No specific or significant aggravating or alleviating factors reported. OTC treatment: none. Cough is affecting sleep.  Received flu shot this year: no.  Social History   Tobacco Use  Smoking Status Former Smoker  . Packs/day: 0.50  . Years: 4.00  . Pack years: 2.00  Smokeless Tobacco Current User  Tobacco Comment   uses  e cigarettes    ROS: As per HPI.   OBJECTIVE:  Vitals:   09/11/18 1244  BP: (!) 156/101  Pulse: 72  Resp: 20  Temp: 98.2 F (36.8 C)  SpO2: 96%     General appearance: alert; appears fatigued HEENT: nasal congestion; clear runny nose; throat irritation secondary to post-nasal  drainage Neck: supple without LAD CV: RRR Lungs: unlabored respirations, symmetrical air entry without wheezing; cough: moderate Abd: soft Ext: no LE edema Skin: warm and dry Psychological: alert and cooperative; normal mood and affect   No Known Allergies   Family History  Problem Relation Age of Onset  . Cancer Sister   . Heart disease Maternal Grandfather   . Cancer Maternal Aunt        sarcoma    Social History   Socioeconomic History  . Marital status: Single    Spouse name: Not on file  . Number of children: Not on file  . Years of education: Not on file  . Highest education level: Not on file  Occupational History  . Not on file  Social Needs  . Financial resource strain: Not on file  . Food insecurity:    Worry: Not on file    Inability: Not on file  . Transportation needs:    Medical: Not on file    Non-medical: Not on file  Tobacco Use  . Smoking status: Former Smoker    Packs/day: 0.50    Years: 4.00    Pack years: 2.00  . Smokeless tobacco: Current User  . Tobacco comment: uses  e cigarettes  Substance and Sexual Activity  . Alcohol use: Yes  . Drug use: No  . Sexual activity: Not on file  Lifestyle  . Physical activity:    Days per week: Not on file    Minutes per session:  Not on file  . Stress: Not on file  Relationships  . Social connections:    Talks on phone: Not on file    Gets together: Not on file    Attends religious service: Not on file    Active member of club or organization: Not on file    Attends meetings of clubs or organizations: Not on file    Relationship status: Not on file  . Intimate partner violence:    Fear of current or ex partner: Not on file    Emotionally abused: Not on file    Physically abused: Not on file    Forced sexual activity: Not on file  Other Topics Concern  . Not on file  Social History Narrative  . Not on file           Mardella LaymanHagler, Harleigh Civello, MD 09/13/18 [phone]

## 2018-09-11 NOTE — ED Triage Notes (Signed)
Pt c/o cough x3 days, states hes been coughing so hard he feels it in his back.

## 2020-04-11 ENCOUNTER — Ambulatory Visit (HOSPITAL_COMMUNITY)
Admission: EM | Admit: 2020-04-11 | Discharge: 2020-04-11 | Disposition: A | Discharge status: Home / Self Care | Payer: HRSA Program | Attending: Urgent Care | Admitting: Urgent Care

## 2020-04-11 ENCOUNTER — Encounter (HOSPITAL_COMMUNITY): Payer: Self-pay

## 2020-04-11 ENCOUNTER — Other Ambulatory Visit: Payer: Self-pay

## 2020-04-11 DIAGNOSIS — R52 Pain, unspecified: Secondary | ICD-10-CM | POA: Diagnosis present

## 2020-04-11 DIAGNOSIS — B349 Viral infection, unspecified: Secondary | ICD-10-CM

## 2020-04-11 DIAGNOSIS — R05 Cough: Secondary | ICD-10-CM | POA: Insufficient documentation

## 2020-04-11 DIAGNOSIS — F17290 Nicotine dependence, other tobacco product, uncomplicated: Secondary | ICD-10-CM | POA: Insufficient documentation

## 2020-04-11 DIAGNOSIS — R059 Cough, unspecified: Secondary | ICD-10-CM

## 2020-04-11 DIAGNOSIS — U071 COVID-19: Secondary | ICD-10-CM | POA: Insufficient documentation

## 2020-04-11 DIAGNOSIS — K644 Residual hemorrhoidal skin tags: Secondary | ICD-10-CM | POA: Diagnosis not present

## 2020-04-11 MED ORDER — LIDOCAINE (ANORECTAL) 5 % EX GEL: CUTANEOUS | 0 refills | Status: AC

## 2020-04-11 MED ORDER — PROMETHAZINE-DM 6.25-15 MG/5ML PO SYRP: 5.0000 mL | ORAL_SOLUTION | Freq: Every evening | ORAL | 0 refills | Status: AC | PRN

## 2020-04-11 MED ORDER — HYDROCORTISONE (PERIANAL) 2.5 % EX CREA: 1.0000 "application " | TOPICAL_CREAM | Freq: Two times a day (BID) | CUTANEOUS | 0 refills | Status: AC

## 2020-04-11 MED ORDER — BENZONATATE 100 MG PO CAPS: 100.0000 mg | ORAL_CAPSULE | Freq: Three times a day (TID) | ORAL | 0 refills | Status: AC | PRN

## 2020-04-11 NOTE — ED Triage Notes (Signed)
Pt presets with cough, body aches and cough. Mucinex gives somewhat relief.

## 2020-04-11 NOTE — ED Provider Notes (Signed)
[facility]   MRN: [id number] DOB: 1976/06/07  Subjective:   [name] is a 44 y.o. male presenting for several day hx of acute onset cough, body aches, runny and stuffy nose. Has used Mucinex. No COVID vaccination, no hx of COVID. Also wants to be checked for hemorrhoids, has a hx of this and has current perianal pain.  No current facility-administered medications for this encounter. No current outpatient medications on file.   No Known Allergies  History reviewed. No pertinent past medical history.   Past Surgical History:  Procedure Laterality Date   ORIF ACETABULAR FRACTURE Right 09/04/2014   Procedure: OPEN REDUCTION INTERNAL FIXATION (ORIF) RIGHT ACETABULAR FRACTURE;  Surgeon: Budd Palmer, MD;  Location: [facility] OR;  Service: Orthopedics;  Laterality: Right;    Family History  Problem Relation Age of Onset   Cancer Sister    Heart disease Maternal Grandfather    Cancer Maternal Aunt        sarcoma     Social History   Tobacco Use   Smoking status: Former Smoker    Packs/day: 0.50    Years: 4.00    Pack years: 2.00   Smokeless tobacco: Current User   Tobacco comment: uses  e cigarettes  Substance Use Topics   Alcohol use: Yes   Drug use: No    ROS   Objective:   Vitals: BP (!) 151/101 (BP Location: Left Arm)    Pulse 85    Temp 98.3 F (36.8 C) (Oral)    Resp 18    SpO2 97%   Physical Exam Constitutional:      General: He is not in acute distress.    Appearance: Normal appearance. He is well-developed and normal weight. He is not ill-appearing, toxic-appearing or diaphoretic.  HENT:     Head: Normocephalic and atraumatic.     Right Ear: External ear normal.     Left Ear: External ear normal.     Nose: Nose normal.     Mouth/Throat:     Mouth: Mucous membranes are moist.     Pharynx: Oropharynx is clear.  Eyes:     General: No scleral icterus.       Right eye: No discharge.        Left eye: No discharge.     Extraocular  Movements: Extraocular movements intact.     Pupils: Pupils are equal, round, and reactive to light.  Cardiovascular:     Rate and Rhythm: Normal rate and regular rhythm.     Heart sounds: Normal heart sounds. No murmur heard.  No friction rub. No gallop.   Pulmonary:     Effort: Pulmonary effort is normal. No respiratory distress.     Breath sounds: Normal breath sounds. No stridor. No wheezing, rhonchi or rales.  Genitourinary:    Rectum: Tenderness and external hemorrhoid present. No anal fissure.    Musculoskeletal:     Cervical back: Normal range of motion.  Neurological:     Mental Status: He is alert and oriented to person, place, and time.  Psychiatric:        Mood and Affect: Mood normal.        Behavior: Behavior normal.        Thought Content: Thought content normal.        Judgment: Judgment normal.     Assessment and Plan :   PDMP not reviewed this encounter.  1. Viral syndrome   2. Cough   3. Body aches  4. External hemorrhoid     Will manage for viral illness such as viral URI, viral syndrome, viral rhinitis, COVID-19. Counseled patient on nature of COVID-19 including modes of transmission, diagnostic testing, management and supportive care.  Offered scripts for symptomatic relief. COVID 19 testing is pending. Use sitz baths, hydrocortisone cream, lidocaine gel, discussed general management of external hemorrhoids. Counseled patient on potential for adverse effects with medications prescribed/recommended today, ER and return-to-clinic precautions discussed, patient verbalized understanding.     Wallis Bamberg, PA-C 04/12/20 1010

## 2020-04-11 NOTE — Discharge Instructions (Addendum)

## 2020-04-12 LAB — SARS CORONAVIRUS 2 (TAT 6-24 HRS): SARS Coronavirus 2: POSITIVE — AB

## 2022-02-14 ENCOUNTER — Encounter (HOSPITAL_COMMUNITY): Payer: Self-pay | Admitting: Emergency Medicine

## 2022-02-14 ENCOUNTER — Ambulatory Visit (HOSPITAL_COMMUNITY)
Admission: EM | Admit: 2022-02-14 | Discharge: 2022-02-14 | Disposition: A | Discharge status: Home / Self Care | Payer: No Typology Code available for payment source | Attending: Physician Assistant | Admitting: Physician Assistant

## 2022-02-14 DIAGNOSIS — L299 Pruritus, unspecified: Secondary | ICD-10-CM

## 2022-02-14 DIAGNOSIS — N4822 Cellulitis of corpus cavernosum and penis: Secondary | ICD-10-CM

## 2022-02-14 DIAGNOSIS — R21 Rash and other nonspecific skin eruption: Secondary | ICD-10-CM

## 2022-02-14 DIAGNOSIS — L03818 Cellulitis of other sites: Secondary | ICD-10-CM

## 2022-02-14 MED ORDER — HYDROCORTISONE 1 % EX CREA: TOPICAL_CREAM | CUTANEOUS | 0 refills | Status: AC

## 2022-02-14 MED ORDER — CEPHALEXIN 500 MG PO CAPS: 500.0000 mg | ORAL_CAPSULE | Freq: Three times a day (TID) | ORAL | 0 refills | Status: AC

## 2022-02-14 NOTE — ED Notes (Signed)
Patient left sweater in room. This Clinical research associate called patient x 2 with no response. This Clinical research associate will leave item in room near patient access with patient label.

## 2022-02-14 NOTE — Discharge Instructions (Addendum)
Advised patient to use the hydrocortisone cream 1% and apply to the area lightly 2-3 times a day over the next several days. Advised patient to take the Keflex 500 mg 1 3 times a day until completed. Advised to use the Vaseline as a protective coating after applying the hydrocortisone cream. Advised to observe and to return if symptoms fail to improve
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
